# Patient Record
Sex: Female | Born: 1970
Health system: Southern US, Community
[De-identification: ages and names within clinical notes are randomized; demographics above are authoritative.]

## PROBLEM LIST (undated history)

## (undated) DIAGNOSIS — E785 Hyperlipidemia, unspecified: Secondary | ICD-10-CM

## (undated) HISTORY — PX: TUBAL LIGATION: SHX77

## (undated) HISTORY — PX: BACK SURGERY: SHX140

## (undated) HISTORY — PX: APPENDECTOMY: SHX54

---

## 2012-08-26 ENCOUNTER — Other Ambulatory Visit: Payer: Self-pay | Admitting: Family Medicine

## 2012-08-26 DIAGNOSIS — Z1231 Encounter for screening mammogram for malignant neoplasm of breast: Secondary | ICD-10-CM

## 2012-09-15 ENCOUNTER — Ambulatory Visit
Admission: RE | Admit: 2012-09-15 | Discharge: 2012-09-15 | Disposition: A | Discharge status: Home / Self Care | Payer: Self-pay | Source: Ambulatory Visit | Attending: Family Medicine | Admitting: Family Medicine

## 2012-09-15 DIAGNOSIS — Z1231 Encounter for screening mammogram for malignant neoplasm of breast: Secondary | ICD-10-CM

## 2012-09-17 ENCOUNTER — Other Ambulatory Visit: Payer: Self-pay | Admitting: Family Medicine

## 2012-09-17 DIAGNOSIS — R928 Other abnormal and inconclusive findings on diagnostic imaging of breast: Secondary | ICD-10-CM

## 2012-09-30 ENCOUNTER — Ambulatory Visit
Admission: RE | Admit: 2012-09-30 | Discharge: 2012-09-30 | Disposition: A | Discharge status: Home / Self Care | Payer: 59 | Source: Ambulatory Visit | Attending: Family Medicine | Admitting: Family Medicine

## 2012-09-30 DIAGNOSIS — R928 Other abnormal and inconclusive findings on diagnostic imaging of breast: Secondary | ICD-10-CM

## 2013-11-16 ENCOUNTER — Other Ambulatory Visit: Payer: Self-pay

## 2013-11-16 DIAGNOSIS — Z1231 Encounter for screening mammogram for malignant neoplasm of breast: Secondary | ICD-10-CM

## 2013-12-14 ENCOUNTER — Ambulatory Visit: Payer: 59

## 2014-01-11 ENCOUNTER — Other Ambulatory Visit: Payer: Self-pay

## 2014-01-11 ENCOUNTER — Ambulatory Visit
Admission: RE | Admit: 2014-01-11 | Discharge: 2014-01-11 | Disposition: A | Discharge status: Home / Self Care | Payer: 59 | Source: Ambulatory Visit

## 2014-01-11 DIAGNOSIS — Z1231 Encounter for screening mammogram for malignant neoplasm of breast: Secondary | ICD-10-CM

## 2014-08-21 ENCOUNTER — Other Ambulatory Visit: Payer: Self-pay | Admitting: Family Medicine

## 2014-08-21 DIAGNOSIS — N63 Unspecified lump in unspecified breast: Secondary | ICD-10-CM

## 2014-08-23 ENCOUNTER — Other Ambulatory Visit: Payer: Self-pay | Admitting: Family Medicine

## 2014-08-23 ENCOUNTER — Ambulatory Visit
Admission: RE | Admit: 2014-08-23 | Discharge: 2014-08-23 | Disposition: A | Discharge status: Home / Self Care | Payer: 59 | Source: Ambulatory Visit | Attending: Family Medicine | Admitting: Family Medicine

## 2014-08-23 DIAGNOSIS — N63 Unspecified lump in unspecified breast: Secondary | ICD-10-CM

## 2014-08-25 ENCOUNTER — Ambulatory Visit
Admission: RE | Admit: 2014-08-25 | Discharge: 2014-08-25 | Disposition: A | Discharge status: Home / Self Care | Payer: 59 | Source: Ambulatory Visit | Attending: Family Medicine | Admitting: Family Medicine

## 2014-08-25 ENCOUNTER — Other Ambulatory Visit: Payer: Self-pay | Admitting: Family Medicine

## 2014-08-25 DIAGNOSIS — N63 Unspecified lump in unspecified breast: Secondary | ICD-10-CM

## 2014-12-19 ENCOUNTER — Other Ambulatory Visit: Payer: Self-pay

## 2014-12-19 DIAGNOSIS — Z1231 Encounter for screening mammogram for malignant neoplasm of breast: Secondary | ICD-10-CM

## 2015-01-12 ENCOUNTER — Other Ambulatory Visit (HOSPITAL_BASED_OUTPATIENT_CLINIC_OR_DEPARTMENT_OTHER): Payer: Self-pay | Admitting: Family Medicine

## 2015-01-12 DIAGNOSIS — E041 Nontoxic single thyroid nodule: Secondary | ICD-10-CM

## 2015-01-15 ENCOUNTER — Ambulatory Visit: Payer: 59

## 2015-02-07 ENCOUNTER — Ambulatory Visit: Payer: 59

## 2015-02-13 ENCOUNTER — Ambulatory Visit
Admission: RE | Admit: 2015-02-13 | Discharge: 2015-02-13 | Disposition: A | Discharge status: Home / Self Care | Payer: 59 | Source: Ambulatory Visit

## 2015-02-13 DIAGNOSIS — Z1231 Encounter for screening mammogram for malignant neoplasm of breast: Secondary | ICD-10-CM

## 2016-06-04 DIAGNOSIS — R1032 Left lower quadrant pain: Secondary | ICD-10-CM | POA: Diagnosis not present

## 2016-06-06 ENCOUNTER — Other Ambulatory Visit (HOSPITAL_COMMUNITY): Payer: Self-pay | Admitting: Internal Medicine

## 2016-06-06 DIAGNOSIS — N926 Irregular menstruation, unspecified: Secondary | ICD-10-CM

## 2016-06-10 ENCOUNTER — Ambulatory Visit (HOSPITAL_COMMUNITY)
Admission: RE | Admit: 2016-06-10 | Discharge: 2016-06-10 | Disposition: A | Discharge status: Home / Self Care | Payer: 59 | Source: Ambulatory Visit | Attending: Internal Medicine | Admitting: Internal Medicine

## 2016-06-10 DIAGNOSIS — R1032 Left lower quadrant pain: Secondary | ICD-10-CM | POA: Diagnosis present

## 2016-06-10 DIAGNOSIS — N83292 Other ovarian cyst, left side: Secondary | ICD-10-CM | POA: Diagnosis not present

## 2016-06-10 DIAGNOSIS — N926 Irregular menstruation, unspecified: Secondary | ICD-10-CM | POA: Insufficient documentation

## 2016-06-10 DIAGNOSIS — D250 Submucous leiomyoma of uterus: Secondary | ICD-10-CM | POA: Diagnosis not present

## 2016-10-06 DIAGNOSIS — N920 Excessive and frequent menstruation with regular cycle: Secondary | ICD-10-CM | POA: Diagnosis not present

## 2016-10-21 DIAGNOSIS — N924 Excessive bleeding in the premenopausal period: Secondary | ICD-10-CM | POA: Diagnosis not present

## 2016-10-21 DIAGNOSIS — N920 Excessive and frequent menstruation with regular cycle: Secondary | ICD-10-CM | POA: Diagnosis not present

## 2016-10-27 ENCOUNTER — Other Ambulatory Visit: Payer: Self-pay | Admitting: Obstetrics and Gynecology

## 2016-10-27 DIAGNOSIS — R1032 Left lower quadrant pain: Secondary | ICD-10-CM | POA: Diagnosis not present

## 2016-10-27 DIAGNOSIS — N841 Polyp of cervix uteri: Secondary | ICD-10-CM | POA: Diagnosis not present

## 2016-10-27 DIAGNOSIS — N879 Dysplasia of cervix uteri, unspecified: Secondary | ICD-10-CM | POA: Diagnosis not present

## 2016-10-27 DIAGNOSIS — N920 Excessive and frequent menstruation with regular cycle: Secondary | ICD-10-CM | POA: Diagnosis not present

## 2016-12-08 DIAGNOSIS — Z1231 Encounter for screening mammogram for malignant neoplasm of breast: Secondary | ICD-10-CM | POA: Diagnosis not present

## 2016-12-11 ENCOUNTER — Other Ambulatory Visit: Payer: Self-pay | Admitting: Obstetrics and Gynecology

## 2016-12-11 DIAGNOSIS — R928 Other abnormal and inconclusive findings on diagnostic imaging of breast: Secondary | ICD-10-CM

## 2016-12-17 ENCOUNTER — Ambulatory Visit
Admission: RE | Admit: 2016-12-17 | Discharge: 2016-12-17 | Disposition: A | Discharge status: Home / Self Care | Payer: 59 | Source: Ambulatory Visit | Attending: Obstetrics and Gynecology | Admitting: Obstetrics and Gynecology

## 2016-12-17 DIAGNOSIS — R928 Other abnormal and inconclusive findings on diagnostic imaging of breast: Secondary | ICD-10-CM

## 2016-12-17 DIAGNOSIS — N6012 Diffuse cystic mastopathy of left breast: Secondary | ICD-10-CM | POA: Diagnosis not present

## 2016-12-17 DIAGNOSIS — R922 Inconclusive mammogram: Secondary | ICD-10-CM | POA: Diagnosis not present

## 2017-09-29 DIAGNOSIS — Z0000 Encounter for general adult medical examination without abnormal findings: Secondary | ICD-10-CM | POA: Diagnosis not present

## 2017-09-29 DIAGNOSIS — R635 Abnormal weight gain: Secondary | ICD-10-CM | POA: Diagnosis not present

## 2017-09-29 DIAGNOSIS — E785 Hyperlipidemia, unspecified: Secondary | ICD-10-CM | POA: Diagnosis not present

## 2017-11-30 DIAGNOSIS — L600 Ingrowing nail: Secondary | ICD-10-CM | POA: Diagnosis not present

## 2017-11-30 DIAGNOSIS — L03031 Cellulitis of right toe: Secondary | ICD-10-CM | POA: Diagnosis not present

## 2017-11-30 DIAGNOSIS — M79671 Pain in right foot: Secondary | ICD-10-CM | POA: Diagnosis not present

## 2017-12-22 DIAGNOSIS — L600 Ingrowing nail: Secondary | ICD-10-CM | POA: Diagnosis not present

## 2017-12-22 DIAGNOSIS — L03032 Cellulitis of left toe: Secondary | ICD-10-CM | POA: Diagnosis not present

## 2017-12-22 DIAGNOSIS — M79672 Pain in left foot: Secondary | ICD-10-CM | POA: Diagnosis not present

## 2018-03-10 DIAGNOSIS — M5416 Radiculopathy, lumbar region: Secondary | ICD-10-CM | POA: Diagnosis not present

## 2018-03-17 DIAGNOSIS — M47816 Spondylosis without myelopathy or radiculopathy, lumbar region: Secondary | ICD-10-CM | POA: Diagnosis not present

## 2018-03-17 DIAGNOSIS — M5416 Radiculopathy, lumbar region: Secondary | ICD-10-CM | POA: Diagnosis not present

## 2018-03-17 DIAGNOSIS — M5126 Other intervertebral disc displacement, lumbar region: Secondary | ICD-10-CM | POA: Diagnosis not present

## 2018-04-19 ENCOUNTER — Telehealth (HOSPITAL_COMMUNITY): Payer: Self-pay

## 2018-04-19 NOTE — Telephone Encounter (Signed)
Marilyn Gomez was contacted today regarding temporary reduction of Outpatient Rehabilitation Services due to concerns for community transmission of COVID-19.  Patient identity was verified.  Assessed if patient needed to be seen in person by clinician (recent fall or acute injury that requires hands on assessment and advice, change in diet order, post-surgical, special cases, etc.).    Patient did not have an acute/special need that requires in person visit. Proceeded with phone call.  Patient clearly stating that she would contact us when she is ready to come in/get her evaluation scheduled.  The patient was offered and declined the continuation of their plan of care by using a telehealth visit.  Outpatient Rehabilitation Services will follow up with this client when we are able to safely resume care.    Patient is aware we can be reached by telephone during limited business hours in the meantime. (include at end of note)  Geraldine Solar PT, DPT [phone]

## 2018-05-18 ENCOUNTER — Telehealth (HOSPITAL_COMMUNITY): Payer: Self-pay | Admitting: Physical Therapy

## 2018-05-18 NOTE — Telephone Encounter (Signed)
Pt was contacted today by phone regarding resuming therapy services following our temporary closure secondary to COVID-19.  Patient identity was verified via DOB.  Pt states she would like to defer therapy for now as she has her elderly inlaws living with her and doesn't want to expose them to anything.   Pt states she will contact our clinic when she wishes to begin therapy  [name], PTA/CLT [phone]

## 2018-06-10 ENCOUNTER — Telehealth (HOSPITAL_COMMUNITY): Payer: Self-pay

## 2018-06-10 NOTE — Telephone Encounter (Signed)
Lmonvm letting the pt know that our office is now open and if she would like to get scheduled for physical therapy. Asked the pt to give [place] a call back either. Advised that we will close this referral within two weeks if we have not heard back from anyone.

## 2018-09-22 ENCOUNTER — Other Ambulatory Visit (HOSPITAL_COMMUNITY): Payer: Self-pay | Admitting: Obstetrics and Gynecology

## 2018-09-22 DIAGNOSIS — Z1231 Encounter for screening mammogram for malignant neoplasm of breast: Secondary | ICD-10-CM

## 2018-10-06 ENCOUNTER — Ambulatory Visit (HOSPITAL_COMMUNITY): Payer: 59

## 2018-10-07 ENCOUNTER — Other Ambulatory Visit: Payer: Self-pay

## 2018-10-07 ENCOUNTER — Ambulatory Visit (HOSPITAL_COMMUNITY)
Admission: RE | Admit: 2018-10-07 | Discharge: 2018-10-07 | Disposition: A | Discharge status: Home / Self Care | Payer: 59 | Source: Ambulatory Visit | Attending: Obstetrics and Gynecology | Admitting: Obstetrics and Gynecology

## 2018-10-07 DIAGNOSIS — Z1231 Encounter for screening mammogram for malignant neoplasm of breast: Secondary | ICD-10-CM | POA: Diagnosis not present

## 2019-03-25 ENCOUNTER — Ambulatory Visit: Payer: Self-pay | Attending: Internal Medicine

## 2019-03-25 DIAGNOSIS — Z23 Encounter for immunization: Secondary | ICD-10-CM

## 2019-03-25 NOTE — Progress Notes (Signed)
   Covid-19 Vaccination Clinic  Name:  Marilyn Gomez    MRN: [id number] DOB: [birth year]  03/25/2019  [name] was observed post Covid-19 immunization for 15 minutes without incident. She was provided with Vaccine Information Sheet and instruction to access the V-Safe system.   [name] was instructed to call 911 with any severe reactions post vaccine: Marland Kitchen Difficulty breathing  . Swelling of face and throat  . A fast heartbeat  . A bad rash all over body  . Dizziness and weakness   Immunizations Administered    Name Date Dose VIS Date Route   Moderna COVID-19 Vaccine 03/25/2019  9:06 AM 0.5 mL 12/07/2018 Intramuscular   Manufacturer: Moderna   Lot: [id number]   [id number]

## 2019-04-26 ENCOUNTER — Ambulatory Visit: Payer: Self-pay | Attending: Internal Medicine

## 2019-04-26 DIAGNOSIS — Z23 Encounter for immunization: Secondary | ICD-10-CM

## 2019-04-26 NOTE — Progress Notes (Signed)
   Covid-19 Vaccination Clinic  Name:  Marilyn Gomez    MRN: [id number] DOB: 1970-07-31  04/26/2019  [name] was observed post Covid-19 immunization for 15 minutes without incident. She was provided with Vaccine Information Sheet and instruction to access the V-Safe system.   [name] was instructed to call 911 with any severe reactions post vaccine: Marland Kitchen Difficulty breathing  . Swelling of face and throat  . A fast heartbeat  . A bad rash all over body  . Dizziness and weakness   Immunizations Administered    Name Date Dose VIS Date Route   Moderna COVID-19 Vaccine 04/26/2019  8:30 AM 0.5 mL 12/2018 Intramuscular   Manufacturer: Moderna   Lot: [id number]   [place][id number]

## 2019-05-20 ENCOUNTER — Encounter: Payer: Self-pay | Admitting: *Deleted

## 2019-08-11 ENCOUNTER — Ambulatory Visit (INDEPENDENT_AMBULATORY_CARE_PROVIDER_SITE_OTHER): Payer: Self-pay | Admitting: *Deleted

## 2019-08-11 DIAGNOSIS — Z1211 Encounter for screening for malignant neoplasm of colon: Secondary | ICD-10-CM

## 2019-08-11 MED ORDER — PEG 3350-KCL-NA BICARB-NACL 420 G PO SOLR: 4000.0000 mL | Freq: Once | ORAL | 0 refills | Status: AC

## 2019-08-11 NOTE — Patient Instructions (Signed)
Marilyn Gomez   05-18-1970 MRN: [id number]    Procedure Date: 10/03/2019 Time to register: 9:00 am Place to register: [facility] Procedure Time: 10:30 Scheduled provider: Dr. Abbey Chatters  PREPARATION FOR COLONOSCOPY WITH TRI-LYTE SPLIT PREP  Please notify us immediately if you are diabetic, take iron supplements, or if you are on Coumadin or any other blood thinners.   Please hold the following medications: n/a  You will need to purchase 1 fleet enema and 1 box of Bisacodyl '5mg'$  tablets.   2 DAYS BEFORE PROCEDURE:  DATE: 10/01/2019   DAY: Saturday Begin clear liquid diet AFTER your lunch meal. NO SOLID FOODS after this point.  1 DAY BEFORE PROCEDURE:  DATE: 10/02/2019   DAY: Sunday Continue clear liquids the entire day - NO SOLID FOOD.   Diabetic medications adjustments for today: n/a  At 2:00 pm:  Take 2 Bisacodyl tablets.   At 4:00pm:  Start drinking your solution. Make sure you mix well per instructions on the bottle. Try to drink 1 (one) 8 ounce glass every 10-15 minutes until you have consumed HALF the jug. You should complete by 6:00pm.You must keep the left over solution refrigerated until completed next day.  Continue clear liquids. You must drink plenty of clear liquids to prevent dehyration and kidney failure.     DAY OF PROCEDURE:   DATE: 10/03/2019   DAY: Monday If you take medications for your heart, blood pressure or breathing, you may take these medications.  Diabetic medications adjustments for today: n/a  Five hours before your procedure time @ 5:30 am:  Finish remaining amout of bowel prep, drinking 1 (one) 8 ounce glass every 10-15 minutes until complete. You have two hours to consume remaining prep.   Three hours before your procedure time @ 7:30 am:  Nothing by mouth.   At least one hour before going to the hospital:  Give yourself one Fleet enema. You may take your morning medications with sip of water unless we have instructed otherwise.       Please see below for Dietary Information.  CLEAR LIQUIDS INCLUDE:  Water Jello (NOT red in color)   Ice Popsicles (NOT red in color)   Tea (sugar ok, no milk/cream) Powdered fruit flavored drinks  Coffee (sugar ok, no milk/cream) Gatorade/ Lemonade/ Kool-Aid  (NOT red in color)   Juice: apple, white grape, white cranberry Soft drinks  Clear bullion, consomme, broth (fat free beef/chicken/vegetable)  Carbonated beverages (any kind)  Strained chicken noodle soup Hard Candy   Remember: Clear liquids are liquids that will allow you to see your fingers on the other side of a clear glass. Be sure liquids are NOT red in color, and not cloudy, but CLEAR.  DO NOT EAT OR DRINK ANY OF THE FOLLOWING:  Dairy products of any kind   Cranberry juice Tomato juice / V8 juice   Grapefruit juice Orange juice     Red grape juice  Do not eat any solid foods, including such foods as: cereal, oatmeal, yogurt, fruits, vegetables, creamed soups, eggs, bread, crackers, pureed foods in a blender, etc.   HELPFUL HINTS FOR DRINKING PREP SOLUTION:   Make sure prep is extremely cold. Mix and refrigerate the the morning of the prep. You may also put in the freezer.   You may try mixing some Crystal Light or Country Time Lemonade if you prefer. Mix in small amounts; add more if necessary.  Try drinking through a straw  Rinse mouth with water or a  mouthwash between glasses, to remove after-taste.  Try sipping on a cold beverage /ice/ popsicles between glasses of prep.  Place a piece of sugar-free hard candy in mouth between glasses.  If you become nauseated, try consuming smaller amounts, or stretch out the time between glasses. Stop for 30-60 minutes, then slowly start back drinking.        OTHER INSTRUCTIONS  You will need a responsible adult at least 51 years of age to accompany you and drive you home. This person must remain in the waiting room during your procedure. The hospital will cancel  your procedure if you do not have a responsible adult with you.   1. Wear loose fitting clothing that is easily removed. 2. Leave jewelry and other valuables at home.  3. Remove all body piercing jewelry and leave at home. 4. Total time from sign-in until discharge is approximately 2-3 hours. 5. You should go home directly after your procedure and rest. You can resume normal activities the day after your procedure. 6. The day of your procedure you should not:  Drive  Make legal decisions  Operate machinery  Drink alcohol  Return to work   You may call the office (Dept: [phone]) before 5:00pm, or page the doctor on call [phone]) after 5:00pm, for further instructions, if necessary.   Insurance Information YOU WILL NEED TO CHECK WITH YOUR INSURANCE COMPANY FOR THE BENEFITS OF COVERAGE YOU HAVE FOR THIS PROCEDURE.  UNFORTUNATELY, NOT ALL INSURANCE COMPANIES HAVE BENEFITS TO COVER ALL OR PART OF THESE TYPES OF PROCEDURES.  IT IS YOUR RESPONSIBILITY TO CHECK YOUR BENEFITS, HOWEVER, WE WILL BE GLAD TO ASSIST YOU WITH ANY CODES YOUR INSURANCE COMPANY MAY NEED.    PLEASE NOTE THAT MOST INSURANCE COMPANIES WILL NOT COVER A SCREENING COLONOSCOPY FOR PEOPLE UNDER THE AGE OF 50  IF YOU HAVE BCBS INSURANCE, YOU MAY HAVE BENEFITS FOR A SCREENING COLONOSCOPY BUT IF POLYPS ARE FOUND THE DIAGNOSIS WILL CHANGE AND THEN YOU MAY HAVE A DEDUCTIBLE THAT WILL NEED TO BE MET. SO PLEASE MAKE SURE YOU CHECK YOUR BENEFITS FOR A SCREENING COLONOSCOPY AS WELL AS A DIAGNOSTIC COLONOSCOPY.

## 2019-08-11 NOTE — Progress Notes (Addendum)
Gastroenterology Pre-Procedure Review  Request Date: 08/11/2019 Requesting Physician: Dr. Aura Dials @ [facility], no previous TCS, Family hx colon cancer (father)  PATIENT REVIEW QUESTIONS: The patient responded to the following health history questions as indicated:    1. Diabetes Melitis: no 2. Joint replacements in the past 12 months: no 3. Major health problems in the past 3 months: no 4. Has an artificial valve or MVP: no 5. Has a defibrillator: no 6. Has been advised in past to take antibiotics in advance of a procedure like teeth cleaning: no 7. Family history of colon cancer: yes, father: age 35  8. Alcohol Use: yes, 3 glasses of wine a weekend 9. Illicit drug Use: no 10. History of sleep apnea: no  11. History of coronary artery or other vascular stents placed within the last 12 months: no 12. History of any prior anesthesia complications: no 13. There is no height or weight on file to calculate BMI. ht: 6'0 wt: 168 lbs    MEDICATIONS & ALLERGIES:    Patient reports the following regarding taking any blood thinners:   Plavix? no Aspirin? no Coumadin? no Brilinta? no Xarelto? no Eliquis? no Pradaxa? no Savaysa? no Effient? no  Patient confirms/reports the following medications:  Current Outpatient Medications  Medication Sig Dispense Refill  . atorvastatin (LIPITOR) 40 MG tablet Take 40 mg by mouth daily.     No current facility-administered medications for this visit.    Patient confirms/reports the following allergies:  No Known Allergies  No orders of the defined types were placed in this encounter.   AUTHORIZATION INFORMATION Primary Insurance: [place],  [place] #:[id number] ,  Group #: [id number] Pre-Cert / Auth required: Yes, approved online for 2/75/1700-17/49/4496 Pre-Cert / Auth #: [id number]  SCHEDULE INFORMATION: Procedure has been scheduled as follows:  Date: 10/03/2019, Time: 10:30 Location: [facility] with Dr. Abbey Chatters  This  Gastroenterology Pre-Precedure Review Form is being routed to the following provider(s): Roseanne Kaufman, NP

## 2019-08-29 NOTE — Progress Notes (Signed)
Appropriate. ASA II.  

## 2019-09-01 ENCOUNTER — Other Ambulatory Visit: Payer: Self-pay | Admitting: *Deleted

## 2019-09-01 ENCOUNTER — Other Ambulatory Visit (HOSPITAL_COMMUNITY): Payer: Self-pay | Admitting: Obstetrics and Gynecology

## 2019-09-01 DIAGNOSIS — Z1231 Encounter for screening mammogram for malignant neoplasm of breast: Secondary | ICD-10-CM

## 2019-09-26 ENCOUNTER — Encounter (HOSPITAL_COMMUNITY): Payer: Self-pay | Admitting: *Deleted

## 2019-09-30 ENCOUNTER — Other Ambulatory Visit (HOSPITAL_COMMUNITY)
Admission: RE | Admit: 2019-09-30 | Discharge: 2019-09-30 | Disposition: A | Discharge status: Home / Self Care | Payer: 59 | Source: Ambulatory Visit | Attending: Internal Medicine | Admitting: Internal Medicine

## 2019-09-30 ENCOUNTER — Other Ambulatory Visit: Payer: Self-pay

## 2019-09-30 DIAGNOSIS — Z01812 Encounter for preprocedural laboratory examination: Secondary | ICD-10-CM | POA: Diagnosis not present

## 2019-09-30 DIAGNOSIS — Z20822 Contact with and (suspected) exposure to covid-19: Secondary | ICD-10-CM | POA: Insufficient documentation

## 2019-09-30 LAB — SARS CORONAVIRUS 2 (TAT 6-24 HRS): SARS Coronavirus 2: NEGATIVE

## 2019-10-03 ENCOUNTER — Encounter (HOSPITAL_COMMUNITY)
Admission: RE | Disposition: A | Discharge status: Home / Self Care | Payer: Self-pay | Source: Home / Self Care | Attending: Internal Medicine

## 2019-10-03 ENCOUNTER — Ambulatory Visit (HOSPITAL_COMMUNITY): Payer: 59 | Admitting: Anesthesiology

## 2019-10-03 ENCOUNTER — Encounter (HOSPITAL_COMMUNITY): Payer: Self-pay

## 2019-10-03 ENCOUNTER — Ambulatory Visit (HOSPITAL_COMMUNITY)
Admission: RE | Admit: 2019-10-03 | Discharge: 2019-10-03 | Disposition: A | Discharge status: Home / Self Care | Payer: 59 | Attending: Internal Medicine | Admitting: Internal Medicine

## 2019-10-03 ENCOUNTER — Other Ambulatory Visit: Payer: Self-pay

## 2019-10-03 DIAGNOSIS — K648 Other hemorrhoids: Secondary | ICD-10-CM | POA: Diagnosis not present

## 2019-10-03 DIAGNOSIS — Z9049 Acquired absence of other specified parts of digestive tract: Secondary | ICD-10-CM | POA: Diagnosis not present

## 2019-10-03 DIAGNOSIS — K635 Polyp of colon: Secondary | ICD-10-CM

## 2019-10-03 DIAGNOSIS — Z79899 Other long term (current) drug therapy: Secondary | ICD-10-CM | POA: Diagnosis not present

## 2019-10-03 DIAGNOSIS — F17210 Nicotine dependence, cigarettes, uncomplicated: Secondary | ICD-10-CM | POA: Diagnosis not present

## 2019-10-03 DIAGNOSIS — D125 Benign neoplasm of sigmoid colon: Secondary | ICD-10-CM | POA: Diagnosis not present

## 2019-10-03 DIAGNOSIS — E785 Hyperlipidemia, unspecified: Secondary | ICD-10-CM | POA: Diagnosis not present

## 2019-10-03 DIAGNOSIS — D123 Benign neoplasm of transverse colon: Secondary | ICD-10-CM | POA: Diagnosis not present

## 2019-10-03 DIAGNOSIS — Z1211 Encounter for screening for malignant neoplasm of colon: Secondary | ICD-10-CM | POA: Insufficient documentation

## 2019-10-03 DIAGNOSIS — Z800 Family history of malignant neoplasm of digestive organs: Secondary | ICD-10-CM | POA: Diagnosis not present

## 2019-10-03 HISTORY — PX: COLONOSCOPY WITH PROPOFOL: SHX5780

## 2019-10-03 HISTORY — DX: Hyperlipidemia, unspecified: E78.5

## 2019-10-03 HISTORY — PX: POLYPECTOMY: SHX5525

## 2019-10-03 SURGERY — COLONOSCOPY WITH PROPOFOL
Anesthesia: General

## 2019-10-03 MED ORDER — LACTATED RINGERS IV SOLN: INTRAVENOUS | Status: DC | PRN

## 2019-10-03 MED ORDER — PROPOFOL 10 MG/ML IV BOLUS
INTRAVENOUS | Status: DC | PRN
  Administered 2019-10-03: 80 mg via INTRAVENOUS
  Administered 2019-10-03: 50 mg via INTRAVENOUS
  Administered 2019-10-03: 150 ug/kg/min via INTRAVENOUS

## 2019-10-03 NOTE — Anesthesia Preprocedure Evaluation (Addendum)
Anesthesia Evaluation  Patient identified by MRN, date of birth, ID band Patient awake    Reviewed: Allergy & Precautions, NPO status , Patient's Chart, lab work & pertinent test results  History of Anesthesia Complications Negative for: history of anesthetic complications  Airway Mallampati: II  TM Distance: >3 FB Neck ROM: Full    Dental  (+) Dental Advisory Given, Teeth Intact, Caps,    Pulmonary Current SmokerPatient did not abstain from smoking.,    Pulmonary exam normal breath sounds clear to auscultation       Cardiovascular Exercise Tolerance: Good Normal cardiovascular exam Rhythm:Regular Rate:Normal     Neuro/Psych negative neurological ROS  negative psych ROS   GI/Hepatic negative GI ROS, (+)     substance abuse  alcohol use,   Endo/Other  negative endocrine ROS  Renal/GU negative Renal ROS     Musculoskeletal negative musculoskeletal ROS (+)   Abdominal   Peds  Hematology negative hematology ROS (+)   Anesthesia Other Findings   Reproductive/Obstetrics negative OB ROS                            Anesthesia Physical Anesthesia Plan  ASA: II  Anesthesia Plan: General   Post-op Pain Management:    Induction: Intravenous  PONV Risk Score and Plan: TIVA  Airway Management Planned: Nasal Cannula and Natural Airway  Additional Equipment:   Intra-op Plan:   Post-operative Plan: Extubation in OR  Informed Consent: I have reviewed the patients History and Physical, chart, labs and discussed the procedure including the risks, benefits and alternatives for the proposed anesthesia with the patient or authorized representative who has indicated his/her understanding and acceptance.     Dental advisory given  Plan Discussed with: CRNA and Surgeon  Anesthesia Plan Comments:         Anesthesia Quick Evaluation

## 2019-10-03 NOTE — Discharge Instructions (Addendum)
Colon Polyps  Polyps are tissue growths inside the body. Polyps can grow in many places, including the large intestine (colon). A polyp may be a round bump or a mushroom-shaped growth. You could have one polyp or several. Most colon polyps are noncancerous (benign). However, some colon polyps can become cancerous over time. Finding and removing the polyps early can help prevent this. What are the causes? The exact cause of colon polyps is not known. What increases the risk? You are more likely to develop this condition if you:  Have a family history of colon cancer or colon polyps.  Are older than 29 or older than 45 if you are African American.  Have inflammatory bowel disease, such as ulcerative colitis or Crohn's disease.  Have certain hereditary conditions, such as: ? Familial adenomatous polyposis. ? Lynch syndrome. ? Turcot syndrome. ? Peutz-Jeghers syndrome.  Are overweight.  Smoke cigarettes.  Do not get enough exercise.  Drink too much alcohol.  Eat a diet that is high in fat and red meat and low in fiber.  Had childhood cancer that was treated with abdominal radiation. What are the signs or symptoms? Most polyps do not cause symptoms. If you have symptoms, they may include:  Blood coming from your rectum when having a bowel movement.  Blood in your stool. The stool may look dark red or black.  Abdominal pain.  A change in bowel habits, such as constipation or diarrhea. How is this diagnosed? This condition is diagnosed with a colonoscopy. This is a procedure in which a lighted, flexible scope is inserted into the anus and then passed into the colon to examine the area. Polyps are sometimes found when a colonoscopy is done as part of routine cancer screening tests. How is this treated? Treatment for this condition involves removing any polyps that are found. Most polyps can be removed during a colonoscopy. Those polyps will then be tested for cancer. Additional  treatment may be needed depending on the results of testing. Follow these instructions at home: Lifestyle  Maintain a healthy weight, or lose weight if recommended by your health care provider.  Exercise every day or as told by your health care provider.  Do not use any products that contain nicotine or tobacco, such as cigarettes and e-cigarettes. If you need help quitting, ask your health care provider.  If you drink alcohol, limit how much you have: ? 0-1 drink a day for women. ? 0-2 drinks a day for men.  Be aware of how much alcohol is in your drink. In the [place] one drink equals one 12 oz bottle of beer (355 mL), one 5 oz glass of wine (148 mL), or one 1 oz shot of hard liquor (44 mL). Eating and drinking   Eat foods that are high in fiber, such as fruits, vegetables, and whole grains.  Eat foods that are high in calcium and vitamin D, such as milk, cheese, yogurt, eggs, liver, fish, and broccoli.  Limit foods that are high in fat, such as fried foods and desserts.  Limit the amount of red meat and processed meat you eat, such as hot dogs, sausage, bacon, and lunch meats. General instructions  Keep all follow-up visits as told by your health care provider. This is important. ? This includes having regularly scheduled colonoscopies. ? Talk to your health care provider about when you need a colonoscopy. Contact a health care provider if:  You have new or worsening bleeding during a bowel movement.  You  have new or increased blood in your stool.  You have a change in bowel habits.  You lose weight for no known reason. Summary  Polyps are tissue growths inside the body. Polyps can grow in many places, including the colon.  Most colon polyps are noncancerous (benign), but some can become cancerous over time.  This condition is diagnosed with a colonoscopy.  Treatment for this condition involves removing any polyps that are found. Most polyps can be removed during a  colonoscopy. This information is not intended to replace advice given to you by your health care provider. Make sure you discuss any questions you have with your health care provider. Document Revised: 04/09/2017 Document Reviewed: 04/09/2017 Elsevier Patient Education  [place].  Colonoscopy Discharge Instructions  Read the instructions outlined below and refer to this sheet in the next few weeks. These discharge instructions provide you with general information on caring for yourself after you leave the hospital. Your doctor may also give you specific instructions. While your treatment has been planned according to the most current medical practices available, unavoidable complications occasionally occur.   ACTIVITY  You may resume your regular activity, but move at a slower pace for the next 24 hours.   Take frequent rest periods for the next 24 hours.   Walking will help get rid of the air and reduce the bloated feeling in your belly (abdomen).   No driving for 24 hours (because of the medicine (anesthesia) used during the test).    Do not sign any important legal documents or operate any machinery for 24 hours (because of the anesthesia used during the test).  NUTRITION  Drink plenty of fluids.   You may resume your normal diet as instructed by your doctor.   Begin with a light meal and progress to your normal diet. Heavy or fried foods are harder to digest and may make you feel sick to your stomach (nauseated).   Avoid alcoholic beverages for 24 hours or as instructed.  MEDICATIONS  You may resume your normal medications unless your doctor tells you otherwise.  WHAT YOU CAN EXPECT TODAY  Some feelings of bloating in the abdomen.   Passage of more gas than usual.   Spotting of blood in your stool or on the toilet paper.  IF YOU HAD POLYPS REMOVED DURING THE COLONOSCOPY:  No aspirin products for 7 days or as instructed.   No alcohol for 7 days or as  instructed.   Eat a soft diet for the next 24 hours.  FINDING OUT THE RESULTS OF YOUR TEST Not all test results are available during your visit. If your test results are not back during the visit, make an appointment with your caregiver to find out the results. Do not assume everything is normal if you have not heard from your caregiver or the medical facility. It is important for you to follow up on all of your test results.  SEEK IMMEDIATE MEDICAL ATTENTION IF:  You have more than a spotting of blood in your stool.   Your belly is swollen (abdominal distention).   You are nauseated or vomiting.   You have a temperature over 101.   You have abdominal pain or discomfort that is severe or gets worse throughout the day.   Your colonoscopy showed 2 polyps, 1 of which was rather large, that I removed successfully.  Await pathology results, my office will contact you hopefully by the end of the week.  Based on current  guidelines, I would recommend we repeat colonoscopy in 3 years.  Follow-up with GI as needed.  I hope you have a great rest of your week!  Elon Alas. Abbey Chatters, D.O. Gastroenterology and Hepatology [facility]

## 2019-10-03 NOTE — Transfer of Care (Signed)
Immediate Anesthesia Transfer of Care Note  Patient: Marilyn Gomez  Procedure(s) Performed: COLONOSCOPY WITH PROPOFOL (N/A ) POLYPECTOMY  Patient Location: Endoscopy Unit  Anesthesia Type:General  Level of Consciousness: awake, alert , oriented and patient cooperative  Airway & Oxygen Therapy: Patient Spontanous Breathing  Post-op Assessment: Report given to RN, Post -op Vital signs reviewed and stable and Patient moving all extremities  Post vital signs: Reviewed and stable  Last Vitals:  Vitals Value Taken Time  BP    Temp    Pulse    Resp    SpO2      Last Pain:  Vitals:   10/03/19 1008  TempSrc:   PainSc: 0-No pain         Complications: No complications documented.

## 2019-10-03 NOTE — H&P (Signed)
Primary Care Physician:  Aura Dials, MD Primary Gastroenterologist:  Dr. Abbey Chatters  Pre-Procedure History & Physical: HPI:  Natalin Bible is a 49 y.o. female is here for a colonoscopy to be performed for screening and family hx of colon cancer (father)  Past Medical History:  Diagnosis Date  . Hyperlipemia     Past Surgical History:  Procedure Laterality Date  . APPENDECTOMY    . BACK SURGERY    . TUBAL LIGATION      Prior to Admission medications   Medication Sig Start Date End Date Taking? Authorizing Provider  atorvastatin (LIPITOR) 40 MG tablet Take 40 mg by mouth at bedtime.    Yes [provider]  polyethylene glycol-electrolytes (NULYTELY) 420 g solution Take 4,000 mLs by mouth as directed. 08/11/19   [provider]    Allergies as of 08/30/2019  . (No Known Allergies)    No family history on file.  Social History   Socioeconomic History  . Marital status: Married    Spouse name: Not on file  . Number of children: Not on file  . Years of education: Not on file  . Highest education level: Not on file  Occupational History  . Not on file  Tobacco Use  . Smoking status: Current Every Day Smoker    Packs/day: 0.50  Vaping Use  . Vaping Use: Never used  Substance and Sexual Activity  . Alcohol use: Yes    Comment: rarely  . Drug use: Not on file  . Sexual activity: Not on file  Other Topics Concern  . Not on file  Social History Narrative  . Not on file   Social Determinants of Health   Financial Resource Strain:   . Difficulty of Paying Living Expenses: Not on file  Food Insecurity:   . Worried About Charity fundraiser in the Last Year: Not on file  . Ran Out of Food in the Last Year: Not on file  Transportation Needs:   . Lack of Transportation (Medical): Not on file  . Lack of Transportation (Non-Medical): Not on file  Physical Activity:   . Days of Exercise per Week: Not on file  . Minutes of Exercise per Session: Not on  file  Stress:   . Feeling of Stress : Not on file  Social Connections:   . Frequency of Communication with Friends and Family: Not on file  . Frequency of Social Gatherings with Friends and Family: Not on file  . Attends Religious Services: Not on file  . Active Member of Clubs or Organizations: Not on file  . Attends Archivist Meetings: Not on file  . Marital Status: Not on file  Intimate Partner Violence:   . Fear of Current or Ex-Partner: Not on file  . Emotionally Abused: Not on file  . Physically Abused: Not on file  . Sexually Abused: Not on file    Review of Systems: See HPI, otherwise negative ROS  Impression/Plan: Amberli Ruegg is here for a colonoscopy to be performed for screening and family hx of colon cancer (father)  Risks, benefits, limitations, imponderables and alternatives regarding colonoscopy have been reviewed with the patient. Questions have been answered. All parties agreeable.

## 2019-10-03 NOTE — Anesthesia Postprocedure Evaluation (Signed)
Anesthesia Post Note  Patient: Marilyn Gomez  Procedure(s) Performed: COLONOSCOPY WITH PROPOFOL (N/A ) POLYPECTOMY  Patient location during evaluation: Endoscopy Anesthesia Type: General Level of consciousness: awake, oriented, awake and alert and patient cooperative Pain management: pain level controlled Vital Signs Assessment: post-procedure vital signs reviewed and stable Respiratory status: spontaneous breathing, respiratory function stable and nonlabored ventilation Cardiovascular status: blood pressure returned to baseline and stable Postop Assessment: no headache and no backache Anesthetic complications: no   No complications documented.   Last Vitals:  Vitals:   10/03/19 0917  BP: 105/67  Pulse: 73  Resp: (!) 24  Temp: 36.6 C  SpO2: 100%    Last Pain:  Vitals:   10/03/19 1008  TempSrc:   PainSc: 0-No pain                 Tacy Learn

## 2019-10-03 NOTE — Op Note (Signed)
[facility] Patient Name: Marilyn Gomez Procedure Date: 10/03/2019 9:19 AM MRN: [id number] Date of Birth: 03/25/1970 Attending MD: Elon Alas. Abbey Chatters DO CSN: [id number] Age: 49 Admit Type: Ambulatory Procedure:                Colonoscopy Indications:              Screening in patient at increased risk: Family                            history of 1st-degree relative with colorectal                            cancer Providers:                Elon Alas. Fenna Semel, DO, Otis Peak B. Sharon Seller, RN,                            Clayton Lefort, RN, Wynonia Musty Tech, Technician Referring MD:              Medicines:                See the Anesthesia note for documentation of the                            administered medications Complications:            No immediate complications. Estimated Blood Loss:     Estimated blood loss was minimal. Procedure:                Pre-Anesthesia Assessment:                           - The anesthesia plan was to use monitored                            anesthesia care (MAC).                           After obtaining informed consent, the colonoscope                            was passed under direct vision. Throughout the                            procedure, the patient's blood pressure, pulse, and                            oxygen saturations were monitored continuously. The                            PCF-H190DL ([id number]) scope was introduced through                            the anus and advanced to the the cecum, identified                            by appendiceal orifice and  ileocecal valve. The                            PCF-H190DL ([id number]) scope was introduced through                            the and advanced to the. The colonoscopy was                            performed without difficulty. The patient tolerated                            the procedure well. The quality of the bowel                            preparation was evaluated using the  BBPS [facility]                            Bowel Preparation Scale) with scores of: Right                            Colon = 3, Transverse Colon = 3 and Left Colon = 3                            (entire mucosa seen well with no residual staining,                            small fragments of stool or opaque liquid). The                            total BBPS score equals 9. Scope In: 10:11:41 AM Scope Out: 10:30:47 AM Scope Withdrawal Time: 0 hours 11 minutes 32 seconds  Total Procedure Duration: 0 hours 19 minutes 6 seconds  Findings:      Non-bleeding internal hemorrhoids were found during endoscopy.      Hemorrhoids were found on perianal exam.      A 8 mm polyp was found in the transverse colon. The polyp was flat. The       polyp was removed with a cold snare. Resection and retrieval were       complete.      A 12 mm polyp was found in the sigmoid colon. The polyp was       pedunculated. The polyp was removed with a hot snare. Resection and       retrieval were complete.      The exam was otherwise without abnormality. Impression:               - Non-bleeding internal hemorrhoids.                           - Hemorrhoids found on perianal exam.                           - One 8 mm polyp in the transverse colon, removed  with a cold snare. Resected and retrieved.                           - One 12 mm polyp in the sigmoid colon, removed                            with a hot snare. Resected and retrieved.                           - The examination was otherwise normal. Moderate Sedation:      Per Anesthesia Care Recommendation:           - Patient has a contact number available for                            emergencies. The signs and symptoms of potential                            delayed complications were discussed with the                            patient. Return to normal activities tomorrow.                            Written discharge instructions  were provided to the                            patient.                           - Resume previous diet.                           - Continue present medications.                           - Await pathology results.                           - Repeat colonoscopy in 3 years for surveillance.                           - Return to GI clinic PRN. Procedure Code(s):        --- Professional ---                           [phone], Colonoscopy, flexible; with removal of                            tumor(s), polyp(s), or other lesion(s) by snare                            technique Diagnosis Code(s):        --- Professional ---  Z80.0, Family history of malignant neoplasm of                            digestive organs                           K64.8, Other hemorrhoids                           K63.5, Polyp of colon CPT copyright 2019 American [employer] Association. All rights reserved. The codes documented in this report are preliminary and upon coder review may  be revised to meet current compliance requirements. Elon Alas. Abbey Chatters, DO [place] Abbey Chatters, DO 10/03/2019 10:35:40 AM This report has been signed electronically. Number of Addenda: 0

## 2019-10-04 LAB — SURGICAL PATHOLOGY

## 2019-10-07 ENCOUNTER — Encounter (HOSPITAL_COMMUNITY): Payer: Self-pay | Admitting: Internal Medicine

## 2019-10-12 ENCOUNTER — Ambulatory Visit (HOSPITAL_COMMUNITY)
Admission: RE | Admit: 2019-10-12 | Discharge: 2019-10-12 | Disposition: A | Discharge status: Home / Self Care | Payer: 59 | Source: Ambulatory Visit | Attending: Obstetrics and Gynecology | Admitting: Obstetrics and Gynecology

## 2019-10-12 ENCOUNTER — Other Ambulatory Visit: Payer: Self-pay

## 2019-10-12 DIAGNOSIS — Z1231 Encounter for screening mammogram for malignant neoplasm of breast: Secondary | ICD-10-CM

## 2020-02-12 ENCOUNTER — Other Ambulatory Visit: Payer: 59

## 2020-09-17 ENCOUNTER — Other Ambulatory Visit (HOSPITAL_COMMUNITY): Payer: Self-pay | Admitting: Family Medicine

## 2020-09-17 DIAGNOSIS — Z1231 Encounter for screening mammogram for malignant neoplasm of breast: Secondary | ICD-10-CM

## 2020-09-28 ENCOUNTER — Other Ambulatory Visit: Payer: Self-pay

## 2020-09-28 ENCOUNTER — Ambulatory Visit: Payer: 59

## 2020-09-28 ENCOUNTER — Ambulatory Visit (INDEPENDENT_AMBULATORY_CARE_PROVIDER_SITE_OTHER): Payer: 59 | Admitting: Orthopedic Surgery

## 2020-09-28 ENCOUNTER — Encounter: Payer: Self-pay | Admitting: Orthopedic Surgery

## 2020-09-28 VITALS — BP 113/75 | HR 66 | Ht 71.0 in | Wt 164.0 lb

## 2020-09-28 DIAGNOSIS — M7711 Lateral epicondylitis, right elbow: Secondary | ICD-10-CM

## 2020-09-28 DIAGNOSIS — M25521 Pain in right elbow: Secondary | ICD-10-CM | POA: Diagnosis not present

## 2020-09-28 NOTE — Patient Instructions (Signed)
Instructions Following Joint Injections  In clinic today, you received an injection in one of your joints (sometimes more than one).  Occasionally, you can have some pain at the injection site, this is normal.  You can place ice at the injection site, or take over-the-counter medications such as Tylenol (acetaminophen) or Advil (ibuprofen).  Please follow all directions listed on the bottle.  If your joint (knee or shoulder) becomes swollen, red or very painful, please contact the clinic for additional assistance.   Two medications were injected, including lidocaine and a steroid (often referred to as cortisone).  Lidocaine is effective almost immediately but wears off quickly.  However, the steroid can take a few days to improve your symptoms.  In some cases, it can make your pain worse for a couple of days.  Do not be concerned if this happens as it is common.  You can apply ice or take some over-the-counter medications as needed.      T[facility] Elbow Rehab Do exercises exactly as told by your health care provider and adjust them as directed. It is normal to feel mild stretching, pulling, tightness, or discomfort as you do these exercises. Stop right away if you feel sudden pain or your pain gets worse.   Stretching and range-of-motion exercises These exercises warm up your muscles and joints and improve the movement andflexibility of your elbow. Wrist flexion, assisted  Straighten your left / right elbow in front of you with your palm facing down toward the floor. If told by your health care provider, bend your left / right elbow to a 90-degree angle (right angle) at your side instead of holding it straight. With your other hand, gently push over the back of your left / right hand so your fingers point toward the floor (flexion). Stop when you feel a gentle stretch on the back of your forearm. Hold this position for 10 seconds. Repeat 10 times. Complete this exercise 1-2 times a day. Wrist  extension, assisted  Straighten your left / right elbow in front of you with your palm facing up toward the ceiling. If told by your health care provider, bend your left / right elbow to a 90-degree angle (right angle) at your side instead of holding it straight. With your other hand, gently pull your left / right hand and fingers toward the floor (extension). Stop when you feel a gentle stretch on the palm side of your forearm. Hold this position for 10 seconds. Repeat 10 times. Complete this exercise 1-2 times a day. Assisted forearm rotation, supination Sit or stand with your elbows at your side. Bend your left / right elbow to a 90-degree angle (right angle). Using your uninjured hand, turn your left / right palm up toward the ceiling (supination) until you feel a gentle stretch along the inside of your forearm. Hold this position for 10 seconds. Repeat 10 times. Complete this exercise 1-2 times a day. Assisted forearm rotation, pronation Sit or stand with your elbows at your side. Bend your left / right elbow to a 90-degree angle (right angle). Using your uninjured hand, turn your left / right palm down toward the floor (pronation) until you feel a gentle stretch along the outside of your forearm. Hold this position for 10 seconds. Repeat 10 times. Complete this exercise 1-2 times a day. Strengthening exercises These exercises build strength and endurance in your forearm and elbow. Endurance is the ability to use your muscles for a long time, even after theyget tired. Radial   deviation  Stand with a 5 lbs weight or a hammer in your left / right hand. Or, sit while holding a rubber exercise band or tubing, with your left / right forearm supported on a table or countertop. Position your forearm so that the thumb is facing the ceiling, as if you are going to clap your hands. This is the neutral position. Raise your hand upward in front of you so your thumb moves toward the ceiling (radial  deviation), or pull up on the rubber tubing. Keep your forearm and elbow still while you move your wrist only. Hold this position for 10 seconds. Slowly return to the starting position. Repeat 10 times. Complete this exercise 1-2 times a day. Wrist extension, eccentric Sit with your left / right forearm palm-down and supported on a table or other surface. Let your left / right wrist extend over the edge of the surface. Hold a 5 lbs (can of soup) weight or a piece of exercise band or tubing in your left / right hand. If using a rubber exercise band or tubing, hold the other end of the tubing with your other hand. Use your uninjured hand to move your left / right hand up toward the ceiling. Take your uninjured hand away and slowly return to the starting position using only your left / right hand. Lowering your arm under tension is called eccentric extension. Repeat 10 times. Complete this exercise 1-2 times a day. Wrist extension  Do not do this exercise if it causes pain at the outside of your elbow. Only do this exercise once instructed by your health care provider. Sit with your left / right forearm supported on a table or other surface and your palm turned down toward the floor. Let your left / right wrist extend over the edge of the surface. Hold a 5 lbs weight or a piece of rubber exercise band or tubing. If you are using a rubber exercise band or tubing, hold the band or tubing in place with your other hand to provide resistance. Slowly bend your wrist so your hand moves up toward the ceiling (extension). Move only your wrist, keeping your forearm and elbow still. Hold this position for 10 seconds. Slowly return to the starting position. Repeat 10 times. Complete this exercise 1-2 times a day. Forearm rotation, supination To do this exercise, you will need a lightweight hammer or rubber mallet. Sit with your left / right forearm supported on a table or other surface. Bend your elbow to a  90-degree angle (right angle). Position your forearm so that your palm is facing down toward the floor, with your hand resting over the edge of the table. Hold a hammer in your left / right hand. To make this exercise easier, hold the hammer near the head of the hammer. To make this exercise harder, hold the hammer near the end of the handle. Without moving your wrist or elbow, slowly rotate your forearm so your palm faces up toward the ceiling (supination). Hold this position for 10 seconds. Slowly return to the starting position. Repeat 10 times. Complete this exercise 1-2 times a day. Shoulder blade squeeze Sit in a stable chair or stand with good posture. If you are sitting down, do not let your back touch the back of the chair. Your arms should be at your sides with your elbows bent to a 90-degree angle (right angle). Position your forearms so that your thumbs are facing the ceiling (neutral position). Without lifting your shoulders   up, squeeze your shoulder blades tightly together. Hold this position for 10 seconds. Slowly release and return to the starting position. Repeat 10 times. Complete this exercise 1-2 times a day. This information is not intended to replace advice given to you by your health care provider. Make sure you discuss any questions you have with your healthcare provider. Document Revised: 03/16/2019 Document Reviewed: 03/16/2019 Elsevier Patient Education  2022 [employer].  

## 2020-09-28 NOTE — Progress Notes (Signed)
New Patient Visit  Assessment: Marilyn Gomez is a 50 y.o. female with the following: 1. Lateral epicondylitis of right elbow  Plan: Patient has pain in the lateral elbow, consistent with lateral epicondylitis.  This is been ongoing for several months.  It is progressively gotten worse and is particularly bad over the past couple of weeks.  She has tried a counterforce brace.  She has tried medications.  She has adjusted her activities.  She is never had an injection.  After discussing all the potential treatment options, she would like to proceed with an injection.  This was completed in clinic today.  I also provided her with exercises for her to work on on her own.  Otherwise, active activities as tolerated.  Procedure note injection - Right lateral elbow   Verbal consent was obtained to inject the right elbow, common extensor origin Timeout was completed to confirm the site of injection.  The point of maximal tenderness was identified. The skin was prepped with alcohol and ethyl chloride was sprayed at the injection site.  A 21-gauge needle was used to inject 40 mg of Depo-Medrol and 1% lidocaine (3 cc) into the common origin of the extensor tendons, overlying the right lateral epicondyle using a direct lateral approach.  There were no complications.  A sterile bandage was applied.     Follow-up: Return if symptoms worsen or fail to improve.  Subjective:  Chief Complaint  Patient presents with   Elbow Pain    Rt elbow pain for 1.5 months but has gotten worse over past 1.5 wks. Unable to carry anything in hand due to pain     History of Present Illness: Marilyn Gomez is a 50 y.o. female who presents for evaluation of right elbow pain.  She locates the pain directly over the lateral aspect of the right elbow.  Pain has been there for a couple of months.  It has progressively worsened and has been particularly bad for the past 2 weeks.  She states her husband has had issues with  tennis elbow in the past, and she therefore tried a counterforce brace without improvement.  She is taking over-the-counter pain medications.  She has continued with her usual activity, but notes progressive worsening at her Pilates.  She has difficulty gripping objects.  She has been avoiding activities because the pain is constant and severe.  She has tried Voltaren topical gel.  No physical therapy.  No previous injections.   Review of Systems: No fevers or chills No numbness or tingling No chest pain No shortness of breath No bowel or bladder dysfunction No GI distress No headaches   Medical History:  Past Medical History:  Diagnosis Date   Hyperlipemia     Past Surgical History:  Procedure Laterality Date   APPENDECTOMY     BACK SURGERY     COLONOSCOPY WITH PROPOFOL N/A 10/03/2019   Procedure: COLONOSCOPY WITH PROPOFOL;  Surgeon: Eloise Harman, DO;  Location: [facility];  Service: Endoscopy;  Laterality: N/A;  10:30   POLYPECTOMY  10/03/2019   Procedure: POLYPECTOMY;  Surgeon: Eloise Harman, DO;  Location: [facility];  Service: Endoscopy;;   TUBAL LIGATION      No family history on file. Social History   Tobacco Use   Smoking status: Every Day    Types: E-cigarettes   Smokeless tobacco: Never  Vaping Use   Vaping Use: Never used  Substance Use Topics   Alcohol use: Yes    Comment:  rarely    No Known Allergies  No outpatient medications have been marked as taking for the 09/28/20 encounter (Office Visit) with [name], MD.    Objective: BP 113/75   Pulse 66   Ht 5\' 11"  (1.803 m)   Wt 164 lb (74.4 kg)   BMI 22.87 kg/m   Physical Exam:  General: Alert and oriented. and No acute distress. Gait: Normal gait.  Evaluation of the right elbow demonstrates no swelling.  No deformity is appreciated.  She has tenderness to palpation directly over the lateral epicondyle.  Pain is recreated with resisted extension of the long finger.  Pain is  recreated with extension of the wrist.  Fingers warm and well-perfused.  No bruising is appreciated over the lateral elbow.  2+ radial pulse.  IMAGING: I personally ordered and reviewed the following images  X-rays of the right elbow were obtained in clinic today and demonstrates no acute injuries.  Minimal degenerative changes are noted.  No evidence of a previous injury to the radial head.  No fracture or dislocation is identified.  Impression: Normal right elbow x-rays   New Medications:  No orders of the defined types were placed in this encounter.     [name], MD  09/28/2020 12:13 PM

## 2020-10-15 ENCOUNTER — Other Ambulatory Visit: Payer: Self-pay

## 2020-10-15 ENCOUNTER — Encounter (HOSPITAL_COMMUNITY): Payer: Self-pay

## 2020-10-15 ENCOUNTER — Ambulatory Visit (HOSPITAL_COMMUNITY)
Admission: RE | Admit: 2020-10-15 | Discharge: 2020-10-15 | Disposition: A | Discharge status: Home / Self Care | Payer: 59 | Source: Ambulatory Visit | Attending: Family Medicine | Admitting: Family Medicine

## 2020-10-15 DIAGNOSIS — Z1231 Encounter for screening mammogram for malignant neoplasm of breast: Secondary | ICD-10-CM | POA: Diagnosis not present

## 2020-10-26 ENCOUNTER — Other Ambulatory Visit: Payer: Self-pay

## 2020-10-26 ENCOUNTER — Ambulatory Visit (INDEPENDENT_AMBULATORY_CARE_PROVIDER_SITE_OTHER): Payer: 59 | Admitting: Orthopedic Surgery

## 2020-10-26 ENCOUNTER — Encounter: Payer: Self-pay | Admitting: Orthopedic Surgery

## 2020-10-26 VITALS — BP 108/67 | HR 69 | Ht 71.0 in | Wt 166.0 lb

## 2020-10-26 DIAGNOSIS — M25521 Pain in right elbow: Secondary | ICD-10-CM

## 2020-10-26 DIAGNOSIS — M7711 Lateral epicondylitis, right elbow: Secondary | ICD-10-CM

## 2020-10-27 ENCOUNTER — Encounter: Payer: Self-pay | Admitting: Orthopedic Surgery

## 2020-10-27 NOTE — Progress Notes (Signed)
Orthopaedic Clinic Return  Assessment: Marilyn Gomez is a 50 y.o. female with the following: Right lateral epicondylitis; recalcitrant to nonoperative management  Plan: Patient reports she had some improvement in her symptoms following the injection, and the pain is progressively worsening.  She is very limited in her activities.  She is relying more heavily on her left arm as a result.  We discussed further treatment options including PRP injections, more therapy and the possibility of proceeding with surgery.  She states that based on the current trajectory of her pain, and her limitations, she is interested in surgery.  We discussed a couple of different options for surgery, and I would like to obtain an MRI prior to finalizing a date for surgery.  Once the MRIs been scheduled, we can contact the patient to discuss the final plan, as well as finalize a date.  This can be set up as a telephone visit.    Follow-up: Return for After MRI; schedule telphone visit to finalize date for surgery .   Subjective:  Chief Complaint  Patient presents with   Elbow Pain    Pt states injection only helpd for a week and pain is coming back. States it's mainly when she picks up items but it's not as bad as previously     History of Present Illness: Marilyn Gomez is a 50 y.o. female who returns to clinic for repeat evaluation of her right elbow.  She has a long history of right lateral epicondylitis.  She has tried activity modifications, pain medications, bracing and a corticosteroid injection.  At the last visit, we injected her right elbow, and the pain improved for about a week.  However, since then the pain has been progressively worsening.  The pain is affecting her ability to continue with her preferred activities.  She is getting very frustrated.  She finds that even daily activities are difficult, and she relies on her left arm for many activities around the house.  She has previously had a series of  injections for other issues, and she is not interested in trying PRP.  Review of Systems: No fevers or chills No numbness or tingling No chest pain No shortness of breath No bowel or bladder dysfunction No GI distress No headaches   Objective: BP 108/67   Pulse 69   Ht 5\' 11"  (1.803 m)   Wt 166 lb (75.3 kg)   BMI 23.15 kg/m   Physical Exam:  Alert and oriented.  No acute distress.  Evaluation the right elbow demonstrates no deformity.  She has full range of motion of the elbow.  She has tenderness to palpation directly over the lateral epicondyle.  Mild discomfort into the common extensor tendon.  Pain with resisted wrist extension.  Pain with resisted extension of the long finger.  Fingers are warm and well-perfused.  2+ radial pulse.  IMAGING: I personally ordered and reviewed the following images:  No new imaging obtained today.   [name], MD 10/27/2020 8:10 AM

## 2020-10-31 ENCOUNTER — Ambulatory Visit (HOSPITAL_COMMUNITY): Payer: 59

## 2020-11-07 ENCOUNTER — Telehealth: Payer: Self-pay | Admitting: Orthopedic Surgery

## 2020-11-07 ENCOUNTER — Other Ambulatory Visit: Payer: Self-pay

## 2020-11-07 ENCOUNTER — Ambulatory Visit (HOSPITAL_COMMUNITY)
Admission: RE | Admit: 2020-11-07 | Discharge: 2020-11-07 | Disposition: A | Discharge status: Home / Self Care | Payer: 59 | Source: Ambulatory Visit | Attending: Orthopedic Surgery | Admitting: Orthopedic Surgery

## 2020-11-07 DIAGNOSIS — M7711 Lateral epicondylitis, right elbow: Secondary | ICD-10-CM | POA: Insufficient documentation

## 2020-11-07 NOTE — Telephone Encounter (Signed)
Patient called to relay that she has had her MRI done today at [facility] and said that Dr Amedeo Kinsman would be discussing results and treatment plan/surgery by phone. Patientsaid she will be traveling the first of the new year; therefore, would like to move forward with what needs to be done as soon as possible. Please advise.

## 2020-11-08 NOTE — Telephone Encounter (Signed)
Results are now available. Please advise.

## 2020-11-09 NOTE — Telephone Encounter (Signed)
Spoke with pt who would like to proceed with a phone visit on 11/8 and aware the call will be after clinic (around lunch time). Will one of you ladies book this for me. Thank you

## 2020-11-13 ENCOUNTER — Ambulatory Visit (INDEPENDENT_AMBULATORY_CARE_PROVIDER_SITE_OTHER): Payer: 59 | Admitting: Orthopedic Surgery

## 2020-11-13 ENCOUNTER — Encounter: Payer: Self-pay | Admitting: Orthopedic Surgery

## 2020-11-13 DIAGNOSIS — M7711 Lateral epicondylitis, right elbow: Secondary | ICD-10-CM | POA: Diagnosis not present

## 2020-11-13 NOTE — H&P (View-Only) (Signed)
Orthopaedic Clinic Return - Virtual Telephone Visit   **Visit was conducted via telephone at the patient's request.  No vital signs or physical exam were completed.  All previous medical records were readily available during my conversation with the patient.  In total, I was on the phone with the patient for 15 minutes**   Location: Patient: Home Provider: [place] - 11-20 minutes   Assessment: Marilyn Gomez is a 50 y.o. female with the following: Right elbow lateral epicondylitis  Plan: [name] continues to have pain in the lateral elbow.  She has tried medications, bracing, exercises as well as a steroid injection.  MRI demonstrates no tearing of the extensor tendons at the lateral epicondyle.  Nonetheless, her presentation is most consistent with lateral epicondylitis.  I have offered her surgery for debridement of the common extensor tendon.  She is interested in proceeding.  The risks and benefits were discussed with the patient, including, but not limited to, infection, bleeding, persistent pain, recurrence of her pain, stiffness of the elbow and more severe complications associated with anesthesia.  She has elected to proceed.  She like to schedule surgery on 11/26/2020.  This will be an outpatient procedure.   Follow-up: Return for After surgery.   Subjective:  Chief Complaint  Patient presents with   Right elbow pain    Persistent right elbow pain, progressive since steroid injection     History of Present Illness: I connected with  Marilyn Gomez on 11/13/20 by a video enabled telemedicine application and verified that I am speaking with the correct person using two identifiers.   I discussed the limitations of evaluation and management by telemedicine. The patient expressed understanding and agreed to proceed.   Marilyn Gomez is a 50 y.o. female who continues to have pain in the lateral elbow.  She states it is affecting her everyday activities.  She previously  had a right elbow steroid injection, which improved her symptoms for approximately 1 week.  Since then, she has had progressively worsening pain in the same spot.  She states she had to stop typing earlier today, due to the pain in the lateral elbow.  The pain will occasionally wake her up at night.  It has been ongoing for over 6 months, and she has tried multiple treatment options.  None have provided sustained relief.   Review of Systems: No fevers or chills No numbness or tingling No chest pain No shortness of breath No bowel or bladder dysfunction No GI distress No headaches   Objective: No vital signs  Physical Exam:  Telephone consult - No exam   IMAGING: I personally ordered and reviewed the following images:  MRI of the right elbow was obtained, and there is no evidence of a tendinous injury at the right lateral epicondyle.  [name], MD 11/13/2020 12:13 PM

## 2020-11-13 NOTE — Progress Notes (Signed)
[facility] Return - Virtual Telephone Visit   **Visit was conducted via telephone at the patient's request.  No vital signs or physical exam were completed.  All previous medical records were readily available during my conversation with the patient.  In total, I was on the phone with the patient for 15 minutes**   Location: Patient: Home Provider: [facility] - 11-20 minutes   Assessment: Marilyn Gomez is a 50 y.o. female with the following: Right elbow lateral epicondylitis  Plan: Marilyn Gomez continues to have pain in the lateral elbow.  She has tried medications, bracing, exercises as well as a steroid injection.  MRI demonstrates no tearing of the extensor tendons at the lateral epicondyle.  Nonetheless, her presentation is most consistent with lateral epicondylitis.  I have offered her surgery for debridement of the common extensor tendon.  She is interested in proceeding.  The risks and benefits were discussed with the patient, including, but not limited to, infection, bleeding, persistent pain, recurrence of her pain, stiffness of the elbow and more severe complications associated with anesthesia.  She has elected to proceed.  She like to schedule surgery on 11/26/2020.  This will be an outpatient procedure.   Follow-up: Return for After surgery.   Subjective:  Chief Complaint  Patient presents with   Right elbow pain    Persistent right elbow pain, progressive since steroid injection     History of Present Illness: I connected with  Marilyn Gomez on 11/13/20 by a video enabled telemedicine application and verified that I am speaking with the correct person using two identifiers.   I discussed the limitations of evaluation and management by telemedicine. The patient expressed understanding and agreed to proceed.   Marilyn Gomez is a 50 y.o. female who continues to have pain in the lateral elbow.  She states it is affecting her everyday activities.  She previously  had a right elbow steroid injection, which improved her symptoms for approximately 1 week.  Since then, she has had progressively worsening pain in the same spot.  She states she had to stop typing earlier today, due to the pain in the lateral elbow.  The pain will occasionally wake her up at night.  It has been ongoing for over 6 months, and she has tried multiple treatment options.  None have provided sustained relief.   Review of Systems: No fevers or chills No numbness or tingling No chest pain No shortness of breath No bowel or bladder dysfunction No GI distress No headaches   Objective: No vital signs  Physical Exam:  Telephone consult - No exam   IMAGING: I personally ordered and reviewed the following images:  MRI of the right elbow was obtained, and there is no evidence of a tendinous injury at the right lateral epicondyle.  [name], MD 11/13/2020 12:13 PM

## 2020-11-21 NOTE — Patient Instructions (Signed)
Your procedure is scheduled on: 11/26/2020  Report to [facility] at  6:15   AM.  Call this number if you have problems the morning of surgery: [phone]   Remember:   Do not Eat or Drink after midnight         No Smoking the morning of surgery  :  Take these medicines the morning of surgery with A SIP OF WATER: none   Do not wear jewelry, make-up or nail polish.  Do not wear lotions, powders, or perfumes. You may wear deodorant.  Do not shave 48 hours prior to surgery. Men may shave face and neck.  Do not bring valuables to the hospital.  Contacts, dentures or bridgework may not be worn into surgery.  Leave suitcase in the car. After surgery it may be brought to your room.  For patients admitted to the hospital, checkout time is 11:00 AM the day of discharge.   Patients discharged the day of surgery will not be allowed to drive home.    Special Instructions: Shower using CHG night before surgery and shower the day of surgery use CHG.  Use special wash - you have one bottle of CHG for all showers.  You should use approximately 1/2 of the bottle for each shower.  How to Use Chlorhexidine for Bathing Chlorhexidine gluconate (CHG) is a germ-killing (antiseptic) solution that is used to clean the skin. It can get rid of the bacteria that normally live on the skin and can keep them away for about 24 hours. To clean your skin with CHG, you may be given: A CHG solution to use in the shower or as part of a sponge bath. A prepackaged cloth that contains CHG. Cleaning your skin with CHG may help lower the risk for infection: While you are staying in the intensive care unit of the hospital. If you have a vascular access, such as a central line, to provide short-term or long-term access to your veins. If you have a catheter to drain urine from your bladder. If you are on a ventilator. A ventilator is a machine that helps you breathe by moving air in and out of your lungs. After  surgery. What are the risks? Risks of using CHG include: A skin reaction. Hearing loss, if CHG gets in your ears and you have a perforated eardrum. Eye injury, if CHG gets in your eyes and is not rinsed out. The CHG product catching fire. Make sure that you avoid smoking and flames after applying CHG to your skin. Do not use CHG: If you have a chlorhexidine allergy or have previously reacted to chlorhexidine. On babies younger than 37 months of age. How to use CHG solution Use CHG only as told by your health care provider, and follow the instructions on the label. Use the full amount of CHG as directed. Usually, this is one bottle. During a shower Follow these steps when using CHG solution during a shower (unless your health care provider gives you different instructions): Start the shower. Use your normal soap and shampoo to wash your face and hair. Turn off the shower or move out of the shower stream. Pour the CHG onto a clean washcloth. Do not use any type of brush or rough-edged sponge. Starting at your neck, lather your body down to your toes. Make sure you follow these instructions: If you will be having surgery, pay special attention to the part of your body where you will be having surgery. Scrub  this area for at least 1 minute. Do not use CHG on your head or face. If the solution gets into your ears or eyes, rinse them well with water. Avoid your genital area. Avoid any areas of skin that have broken skin, cuts, or scrapes. Scrub your back and under your arms. Make sure to wash skin folds. Let the lather sit on your skin for 1-2 minutes or as long as told by your health care provider. Thoroughly rinse your entire body in the shower. Make sure that all body creases and crevices are rinsed well. Dry off with a clean towel. Do not put any substances on your body afterward--such as powder, lotion, or perfume--unless you are told to do so by your health care provider. Only use lotions  that are recommended by the manufacturer. Put on clean clothes or pajamas. If it is the night before your surgery, sleep in clean sheets.  During a sponge bath Follow these steps when using CHG solution during a sponge bath (unless your health care provider gives you different instructions): Use your normal soap and shampoo to wash your face and hair. Pour the CHG onto a clean washcloth. Starting at your neck, lather your body down to your toes. Make sure you follow these instructions: If you will be having surgery, pay special attention to the part of your body where you will be having surgery. Scrub this area for at least 1 minute. Do not use CHG on your head or face. If the solution gets into your ears or eyes, rinse them well with water. Avoid your genital area. Avoid any areas of skin that have broken skin, cuts, or scrapes. Scrub your back and under your arms. Make sure to wash skin folds. Let the lather sit on your skin for 1-2 minutes or as long as told by your health care provider. Using a different clean, wet washcloth, thoroughly rinse your entire body. Make sure that all body creases and crevices are rinsed well. Dry off with a clean towel. Do not put any substances on your body afterward--such as powder, lotion, or perfume--unless you are told to do so by your health care provider. Only use lotions that are recommended by the manufacturer. Put on clean clothes or pajamas. If it is the night before your surgery, sleep in clean sheets. How to use CHG prepackaged cloths Only use CHG cloths as told by your health care provider, and follow the instructions on the label. Use the CHG cloth on clean, dry skin. Do not use the CHG cloth on your head or face unless your health care provider tells you to. When washing with the CHG cloth: Avoid your genital area. Avoid any areas of skin that have broken skin, cuts, or scrapes. Before surgery Follow these steps when using a CHG cloth to  clean before surgery (unless your health care provider gives you different instructions): Using the CHG cloth, vigorously scrub the part of your body where you will be having surgery. Scrub using a back-and-forth motion for 3 minutes. The area on your body should be completely wet with CHG when you are done scrubbing. Do not rinse. Discard the cloth and let the area air-dry. Do not put any substances on the area afterward, such as powder, lotion, or perfume. Put on clean clothes or pajamas. If it is the night before your surgery, sleep in clean sheets.  For general bathing Follow these steps when using CHG cloths for general bathing (unless your health care provider  gives you different instructions). Use a separate CHG cloth for each area of your body. Make sure you wash between any folds of skin and between your fingers and toes. Wash your body in the following order, switching to a new cloth after each step: The front of your neck, shoulders, and chest. Both of your arms, under your arms, and your hands. Your stomach and groin area, avoiding the genitals. Your right leg and foot. Your left leg and foot. The back of your neck, your back, and your buttocks. Do not rinse. Discard the cloth and let the area air-dry. Do not put any substances on your body afterward--such as powder, lotion, or perfume--unless you are told to do so by your health care provider. Only use lotions that are recommended by the manufacturer. Put on clean clothes or pajamas. Contact a health care provider if: Your skin gets irritated after scrubbing. You have questions about using your solution or cloth. You swallow any chlorhexidine. Call your local poison control center (1-704-309-4311 in the [place] Get help right away if: Your eyes itch badly, or they become very red or swollen. Your skin itches badly and is red or swollen. Your hearing changes. You have trouble seeing. You have swelling or tingling in your mouth or  throat. You have trouble breathing. These symptoms may represent a serious problem that is an emergency. Do not wait to see if the symptoms will go away. Get medical help right away. Call your local emergency services (911 in the [place] Do not drive yourself to the hospital. Summary Chlorhexidine gluconate (CHG) is a germ-killing (antiseptic) solution that is used to clean the skin. Cleaning your skin with CHG may help to lower your risk for infection. You may be given CHG to use for bathing. It may be in a bottle or in a prepackaged cloth to use on your skin. Carefully follow your health care provider's instructions and the instructions on the product label. Do not use CHG if you have a chlorhexidine allergy. Contact your health care provider if your skin gets irritated after scrubbing. This information is not intended to replace advice given to you by your health care provider. Make sure you discuss any questions you have with your health care provider. Document Revised: 03/05/2020 Document Reviewed: 03/05/2020 Elsevier Patient Education  2022"[employer]" Stretch  Sutured Wound Care Sutures are stitches that can be used to close wounds. Some stitches break down as they heal (absorbable). Other stitches need to be taken out by your doctor (nonabsorbable). Taking good care of your wound can help to prevent pain and infection. It can also help your wound heal more quickly. Follow instructions from your doctor about how to care for your sutured wound. Supplies needed: Soap and water. A clean, dry towel. Solution to clean your wound, if needed. A clean gauze or bandage (dressing), if needed. Antibiotic ointment, if told by your doctor. How to care for your sutured wound  Keep the wound fully dry for the first 24 hours or as long as told by your doctor. After 24-48 hours, you may shower or bathe as told by your doctor. Do not soak the wound or put the wound under water until the stitches have  been taken out. After the first 24 hours, clean the wound once a day, or as often as your doctor tells you to. Take these steps: Wash and rinse the wound as told by your health care provider. Pat the wound dry with a clean towel. Do  not rub the wound. After cleaning the wound, put a thin layer of antibiotic ointment on the wound as told by your doctor. This will help: Prevent infection. Keep the bandage from sticking to the wound. Follow instructions from your doctor about how to change your bandage. Make sure you: Wash your hands with soap and water for at least 20 seconds. If you cannot use soap and water, use hand sanitizer. Change your bandage at least once a day, or as often as told by your doctor. If your dressing gets wet or dirty, change it. Leavestitches in place for at least 2 weeks. If you have skin glue over your stitches, this should also stay in place for at least 2 weeks. Leave tape strips alone (if you have them) unless you are told to take them off. You may trim the edges of the tape strips if they curl up. Check your wound every day for signs of infection. Watch for: Redness, swelling, or pain. Fluid or blood. New warmth, a rash, or hardness at the wound site. Pus or a bad smell. Have the stitches taken out as told by your doctor. Follow these instructions at home: Medicines Take or apply over-the-counter and prescription medicines only as told by your doctor. If you were prescribed an antibiotic medicine or ointment, take or apply it as told by your doctor. Do not stop using the antibiotic even if you start to feel better. General instructions Cover your wound with clothes or put sunscreen on when you are outside. Use a sunscreen of at least 30 SPF. Do not scratch or pick at your wound. Avoid stretching your wound. Raise the injured area above the level of your heart while you are sitting or lying down, if possible. Eat a diet that includes protein, vitamin A, and  vitamin C. Doing this will help your wound heal. Drink enough fluid to keep your pee (urine) pale yellow. Keep all follow-up visits. Contact a doctor if: You were given a tetanus shot and you have any of the following at the site where the needle went in: Swelling. Very bad pain. Redness. Bleeding. Your wound breaks open. You see something coming out of your wound, such as wood or glass. You have any of these signs of infection in or around your wound: Redness, swelling, or pain. Fluid or blood. Warmth. A new rash. Your wound feels hard. You have a fever. The skin near your wound changes color. You have pain that does not get better with medicine. You get numbness around the wound. Get help right away if: You have very bad swelling or more pain around your wound. You have pus or a bad smell coming from your wound. You have painful lumps near your wound or anywhere on your body. You have a red streak going away from your wound. The wound is on your hand or foot, and: Your fingers or toes look pale or blue. You cannot move a finger or toe as you used to do. You have numbness that spreads down your hand, foot, fingers, or toes. Summary Sutures are stitches that are used to close wounds. Taking good care of your wound can help to prevent pain and infection. Keep the wound fully dry for the first 24 hours or for as long as told by your doctor. After 24-48 hours, you may shower or bathe as told by your doctor. This information is not intended to replace advice given to you by your health care provider. Make sure you  discuss any questions you have with your health care provider. Document Revised: 04/30/2020 Document Reviewed: 04/30/2020 Elsevier Patient Education  2022 [place] Anesthesia, Adult, Care After This sheet gives you information about how to care for yourself after your procedure. Your health care provider may also give you more specific instructions. If you  have problems or questions, contact your health care provider. What can I expect after the procedure? After the procedure, the following side effects are common: Pain or discomfort at the IV site. Nausea. Vomiting. Sore throat. Trouble concentrating. Feeling cold or chills. Feeling weak or tired. Sleepiness and fatigue. Soreness and body aches. These side effects can affect parts of the body that were not involved in surgery. Follow these instructions at home: For the time period you were told by your health care provider:  Rest. Do not participate in activities where you could fall or become injured. Do not drive or use machinery. Do not drink alcohol. Do not take sleeping pills or medicines that cause drowsiness. Do not make important decisions or sign legal documents. Do not take care of children on your own. Eating and drinking Follow any instructions from your health care provider about eating or drinking restrictions. When you feel hungry, start by eating small amounts of foods that are soft and easy to digest (bland), such as toast. Gradually return to your regular diet. Drink enough fluid to keep your urine pale yellow. If you vomit, rehydrate by drinking water, juice, or clear broth. General instructions If you have sleep apnea, surgery and certain medicines can increase your risk for breathing problems. Follow instructions from your health care provider about wearing your sleep device: Anytime you are sleeping, including during daytime naps. While taking prescription pain medicines, sleeping medicines, or medicines that make you drowsy. Have a responsible adult stay with you for the time you are told. It is important to have someone help care for you until you are awake and alert. Return to your normal activities as told by your health care provider. Ask your health care provider what activities are safe for you. Take over-the-counter and prescription medicines only as told  by your health care provider. If you smoke, do not smoke without supervision. Keep all follow-up visits as told by your health care provider. This is important. Contact a health care provider if: You have nausea or vomiting that does not get better with medicine. You cannot eat or drink without vomiting. You have pain that does not get better with medicine. You are unable to pass urine. You develop a skin rash. You have a fever. You have redness around your IV site that gets worse. Get help right away if: You have difficulty breathing. You have chest pain. You have blood in your urine or stool, or you vomit blood. Summary After the procedure, it is common to have a sore throat or nausea. It is also common to feel tired. Have a responsible adult stay with you for the time you are told. It is important to have someone help care for you until you are awake and alert. When you feel hungry, start by eating small amounts of foods that are soft and easy to digest (bland), such as toast. Gradually return to your regular diet. Drink enough fluid to keep your urine pale yellow. Return to your normal activities as told by your health care provider. Ask your health care provider what activities are safe for you. This information is not intended to replace advice  given to you by your health care provider. Make sure you discuss any questions you have with your health care provider. Document Revised: 09/08/2019 Document Reviewed: 04/07/2019 Elsevier Patient Education  2022 [employer].

## 2020-11-22 ENCOUNTER — Other Ambulatory Visit: Payer: Self-pay

## 2020-11-22 ENCOUNTER — Encounter (HOSPITAL_COMMUNITY)
Admission: RE | Admit: 2020-11-22 | Discharge: 2020-11-22 | Disposition: A | Discharge status: Home / Self Care | Payer: 59 | Source: Ambulatory Visit | Attending: Orthopedic Surgery | Admitting: Orthopedic Surgery

## 2020-11-22 ENCOUNTER — Encounter (HOSPITAL_COMMUNITY): Payer: Self-pay

## 2020-11-22 VITALS — BP 111/71 | HR 75 | Temp 98.6°F | Resp 18 | Ht 72.0 in | Wt 160.0 lb

## 2020-11-22 DIAGNOSIS — M7711 Lateral epicondylitis, right elbow: Secondary | ICD-10-CM | POA: Diagnosis not present

## 2020-11-22 DIAGNOSIS — Z01812 Encounter for preprocedural laboratory examination: Secondary | ICD-10-CM | POA: Diagnosis not present

## 2020-11-22 DIAGNOSIS — Z01818 Encounter for other preprocedural examination: Secondary | ICD-10-CM

## 2020-11-22 LAB — BASIC METABOLIC PANEL
Anion gap: 7 (ref 5–15)
BUN: 20 mg/dL (ref 6–20)
CO2: 25 mmol/L (ref 22–32)
Calcium: 9.1 mg/dL (ref 8.9–10.3)
Chloride: 105 mmol/L (ref 98–111)
Creatinine, Ser: 0.8 mg/dL (ref 0.44–1.00)
GFR, Estimated: >60 mL/min (ref >60)
Glucose, Bld: 81 mg/dL (ref 70–99)
Potassium: 4.5 mmol/L (ref 3.5–5.1)
Sodium: 137 mmol/L (ref 135–145)

## 2020-11-22 LAB — CBC
HCT: 41.3 % (ref 36.0–46.0)
Hemoglobin: 13.5 g/dL (ref 12.0–15.0)
MCH: 30.5 pg (ref 26.0–34.0)
MCHC: 32.7 g/dL (ref 30.0–36.0)
MCV: 93.4 fL (ref 80.0–100.0)
Platelets: 274 10*3/uL (ref 150–400)
RBC: 4.42 MIL/uL (ref 3.87–5.11)
RDW: 12.8 % (ref 11.5–15.5)
WBC: 5.8 10*3/uL (ref 4.0–10.5)
nRBC: 0 % (ref 0.0–0.2)

## 2020-11-22 LAB — HCG, SERUM, QUALITATIVE: Preg, Serum: NEGATIVE

## 2020-11-26 ENCOUNTER — Ambulatory Visit (HOSPITAL_COMMUNITY): Payer: 59 | Admitting: Anesthesiology

## 2020-11-26 ENCOUNTER — Ambulatory Visit (HOSPITAL_COMMUNITY)
Admission: RE | Admit: 2020-11-26 | Discharge: 2020-11-26 | Disposition: A | Discharge status: Home / Self Care | Payer: 59 | Source: Ambulatory Visit | Attending: Orthopedic Surgery | Admitting: Orthopedic Surgery

## 2020-11-26 ENCOUNTER — Encounter (HOSPITAL_COMMUNITY): Payer: Self-pay | Admitting: Orthopedic Surgery

## 2020-11-26 ENCOUNTER — Encounter (HOSPITAL_COMMUNITY)
Admission: RE | Disposition: A | Discharge status: Home / Self Care | Payer: Self-pay | Source: Ambulatory Visit | Attending: Orthopedic Surgery

## 2020-11-26 DIAGNOSIS — Z87891 Personal history of nicotine dependence: Secondary | ICD-10-CM | POA: Insufficient documentation

## 2020-11-26 DIAGNOSIS — M7711 Lateral epicondylitis, right elbow: Secondary | ICD-10-CM | POA: Diagnosis not present

## 2020-11-26 HISTORY — PX: TENNIS ELBOW RELEASE/NIRSCHEL PROCEDURE: SHX6651

## 2020-11-26 SURGERY — TENNIS ELBOW RELEASE/NIRSCHEL PROCEDURE
Anesthesia: General | Site: Elbow | Laterality: Right

## 2020-11-26 MED ORDER — DOCUSATE SODIUM 100 MG PO CAPS: 100.0000 mg | ORAL_CAPSULE | Freq: Every day | ORAL | 1 refills | Status: AC | PRN

## 2020-11-26 MED ORDER — HYDROMORPHONE HCL 1 MG/ML IJ SOLN
0.2500 mg | INTRAMUSCULAR | Status: DC | PRN
  Administered 2020-11-26 (×2): 0.25 mg via INTRAVENOUS

## 2020-11-26 MED ORDER — KETOROLAC TROMETHAMINE 30 MG/ML IJ SOLN
INTRAMUSCULAR | Status: DC | PRN
  Administered 2020-11-26: 30 mg via INTRAVENOUS

## 2020-11-26 MED ORDER — MIDAZOLAM HCL 2 MG/2ML IJ SOLN
INTRAMUSCULAR | Status: DC | PRN
  Administered 2020-11-26: 2 mg via INTRAVENOUS

## 2020-11-26 MED ORDER — KETOROLAC TROMETHAMINE 30 MG/ML IJ SOLN
INTRAMUSCULAR | Status: AC
  Filled 2020-11-26: qty 1

## 2020-11-26 MED ORDER — ONDANSETRON HCL 4 MG PO TABS: 4.0000 mg | ORAL_TABLET | Freq: Three times a day (TID) | ORAL | 0 refills | Status: AC | PRN

## 2020-11-26 MED ORDER — ONDANSETRON HCL 4 MG/2ML IJ SOLN
INTRAMUSCULAR | Status: AC
  Filled 2020-11-26: qty 2

## 2020-11-26 MED ORDER — CELECOXIB 100 MG PO CAPS: 100.0000 mg | ORAL_CAPSULE | Freq: Every day | ORAL | 0 refills | Status: AC

## 2020-11-26 MED ORDER — FENTANYL CITRATE (PF) 250 MCG/5ML IJ SOLN
INTRAMUSCULAR | Status: DC | PRN
  Administered 2020-11-26 (×2): 25 ug via INTRAVENOUS
  Administered 2020-11-26: 50 ug via INTRAVENOUS

## 2020-11-26 MED ORDER — DEXAMETHASONE SODIUM PHOSPHATE 10 MG/ML IJ SOLN
INTRAMUSCULAR | Status: DC | PRN
Start: 2020-11-26 — End: 2020-11-26
  Administered 2020-11-26: 10 mg via INTRAVENOUS

## 2020-11-26 MED ORDER — HYDROCORTISONE 2 % EX LOTN: 3.0000 mL | TOPICAL_LOTION | Freq: Three times a day (TID) | CUTANEOUS | 0 refills | Status: DC | PRN

## 2020-11-26 MED ORDER — LACTATED RINGERS IV SOLN
INTRAVENOUS | Status: DC
  Administered 2020-11-26: 1000 mL via INTRAVENOUS

## 2020-11-26 MED ORDER — LIDOCAINE HCL (CARDIAC) PF 100 MG/5ML IV SOSY
PREFILLED_SYRINGE | INTRAVENOUS | Status: DC | PRN
Start: 2020-11-26 — End: 2020-11-26
  Administered 2020-11-26: 60 mg via INTRAVENOUS

## 2020-11-26 MED ORDER — HYDROMORPHONE HCL 1 MG/ML IJ SOLN
INTRAMUSCULAR | Status: AC
  Filled 2020-11-26: qty 0.5

## 2020-11-26 MED ORDER — MIDAZOLAM HCL 2 MG/2ML IJ SOLN
INTRAMUSCULAR | Status: AC
  Filled 2020-11-26: qty 2

## 2020-11-26 MED ORDER — CHLORHEXIDINE GLUCONATE 0.12 % MT SOLN
15.0000 mL | Freq: Once | OROMUCOSAL | Status: AC
  Administered 2020-11-26: 15 mL via OROMUCOSAL

## 2020-11-26 MED ORDER — ORAL CARE MOUTH RINSE: 15.0000 mL | Freq: Once | OROMUCOSAL | Status: AC

## 2020-11-26 MED ORDER — PROPOFOL 10 MG/ML IV BOLUS
INTRAVENOUS | Status: DC | PRN
  Administered 2020-11-26: 180 mg via INTRAVENOUS

## 2020-11-26 MED ORDER — SODIUM CHLORIDE 0.9 % IR SOLN
Status: DC | PRN
  Administered 2020-11-26: 1000 mL

## 2020-11-26 MED ORDER — ONDANSETRON HCL 4 MG/2ML IJ SOLN
INTRAMUSCULAR | Status: DC | PRN
  Administered 2020-11-26: 4 mg via INTRAVENOUS

## 2020-11-26 MED ORDER — ONDANSETRON HCL 4 MG/2ML IJ SOLN: 4.0000 mg | Freq: Once | INTRAMUSCULAR | Status: DC | PRN

## 2020-11-26 MED ORDER — BUPIVACAINE-EPINEPHRINE 0.5% -1:200000 IJ SOLN
INTRAMUSCULAR | Status: DC | PRN
  Administered 2020-11-26: 10 mL

## 2020-11-26 MED ORDER — DEXAMETHASONE SODIUM PHOSPHATE 10 MG/ML IJ SOLN
INTRAMUSCULAR | Status: AC
  Filled 2020-11-26: qty 1

## 2020-11-26 MED ORDER — CEFAZOLIN SODIUM-DEXTROSE 2-4 GM/100ML-% IV SOLN
INTRAVENOUS | Status: AC
  Filled 2020-11-26: qty 100

## 2020-11-26 MED ORDER — CHLORHEXIDINE GLUCONATE 0.12 % MT SOLN
OROMUCOSAL | Status: AC
  Filled 2020-11-26: qty 15

## 2020-11-26 MED ORDER — PROPOFOL 10 MG/ML IV BOLUS
INTRAVENOUS | Status: AC
  Filled 2020-11-26: qty 40

## 2020-11-26 MED ORDER — FENTANYL CITRATE (PF) 100 MCG/2ML IJ SOLN
INTRAMUSCULAR | Status: AC
  Filled 2020-11-26: qty 2

## 2020-11-26 MED ORDER — HYDROCODONE-ACETAMINOPHEN 5-325 MG PO TABS: 1.0000 | ORAL_TABLET | Freq: Four times a day (QID) | ORAL | 0 refills | Status: AC | PRN

## 2020-11-26 MED ORDER — MEPERIDINE HCL 50 MG/ML IJ SOLN: 6.2500 mg | INTRAMUSCULAR | Status: DC | PRN

## 2020-11-26 MED ORDER — CEFAZOLIN SODIUM-DEXTROSE 2-4 GM/100ML-% IV SOLN
2.0000 g | INTRAVENOUS | Status: AC
  Administered 2020-11-26: 2 g via INTRAVENOUS

## 2020-11-26 MED ORDER — BUPIVACAINE-EPINEPHRINE (PF) 0.5% -1:200000 IJ SOLN
INTRAMUSCULAR | Status: AC
  Filled 2020-11-26: qty 30

## 2020-11-26 MED ORDER — LIDOCAINE HCL (PF) 2 % IJ SOLN
INTRAMUSCULAR | Status: AC
  Filled 2020-11-26: qty 5

## 2020-11-26 MED ORDER — SEVOFLURANE IN SOLN
RESPIRATORY_TRACT | Status: AC
  Filled 2020-11-26: qty 250

## 2020-11-26 SURGICAL SUPPLY — 48 items
APL PRP STRL LF DISP 70% ISPRP (MISCELLANEOUS) ×1
BANDAGE ELASTIC 4 VELCRO NS (GAUZE/BANDAGES/DRESSINGS) ×2 IMPLANT
BANDAGE ESMARK 4X12 BL STRL LF (DISPOSABLE) ×1 IMPLANT
BNDG CMPR 12X4 ELC STRL LF (DISPOSABLE) ×1
BNDG CMPR STD VLCR NS LF 5.8X4 (GAUZE/BANDAGES/DRESSINGS) ×1
BNDG COHESIVE 4X5 TAN ST LF (GAUZE/BANDAGES/DRESSINGS) ×2 IMPLANT
BNDG ELASTIC 4X5.8 VLCR NS LF (GAUZE/BANDAGES/DRESSINGS) ×2 IMPLANT
BNDG ESMARK 4X12 BLUE STRL LF (DISPOSABLE) ×2
CHLORAPREP W/TINT 26 (MISCELLANEOUS) ×2 IMPLANT
CLOSURE STERI STRIP 1/2 X4 (GAUZE/BANDAGES/DRESSINGS) ×2 IMPLANT
CLOTH BEACON ORANGE TIMEOUT ST (SAFETY) ×2 IMPLANT
CORD BIPOLAR FORCEPS 12FT (ELECTRODE) ×2 IMPLANT
COVER LIGHT HANDLE STERIS (MISCELLANEOUS) ×4 IMPLANT
CUFF TOURN SGL QUICK 18X4 (TOURNIQUET CUFF) ×2 IMPLANT
DRAPE HALF SHEET 40X57 (DRAPES) ×2 IMPLANT
DRSG AQUACEL AG ADV 3.5X10 (GAUZE/BANDAGES/DRESSINGS) ×2 IMPLANT
DRSG TEGADERM 4X4.75 (GAUZE/BANDAGES/DRESSINGS) ×2 IMPLANT
ELECT REM PT RETURN 9FT ADLT (ELECTROSURGICAL) ×2
ELECTRODE REM PT RTRN 9FT ADLT (ELECTROSURGICAL) ×1 IMPLANT
GLOVE SRG 8 PF TXTR STRL LF DI (GLOVE) ×1 IMPLANT
GLOVE SURG POLYISO LF SZ8 (GLOVE) ×6 IMPLANT
GLOVE SURG UNDER POLY LF SZ7 (GLOVE) ×6 IMPLANT
GLOVE SURG UNDER POLY LF SZ8 (GLOVE) ×2
GOWN STRL REUS W/ TWL XL LVL3 (GOWN DISPOSABLE) ×1 IMPLANT
GOWN STRL REUS W/TWL LRG LVL3 (GOWN DISPOSABLE) ×4 IMPLANT
GOWN STRL REUS W/TWL XL LVL3 (GOWN DISPOSABLE) ×2
INST SET MINOR BONE (KITS) ×2 IMPLANT
KIT TURNOVER KIT A (KITS) ×2 IMPLANT
MANIFOLD NEPTUNE II (INSTRUMENTS) ×2 IMPLANT
NEEDLE HYPO 21X1.5 SAFETY (NEEDLE) ×2 IMPLANT
NS IRRIG 1000ML POUR BTL (IV SOLUTION) ×2 IMPLANT
PACK BASIC LIMB (CUSTOM PROCEDURE TRAY) ×2 IMPLANT
PAD ARMBOARD 7.5X6 YLW CONV (MISCELLANEOUS) ×2 IMPLANT
PAD CAST 4YDX4 CTTN HI CHSV (CAST SUPPLIES) IMPLANT
PADDING CAST COTTON 4X4 STRL (CAST SUPPLIES)
SET BASIN LINEN APH (SET/KITS/TRAYS/PACK) ×2 IMPLANT
SLING ARM FOAM STRAP LRG (SOFTGOODS) ×2 IMPLANT
SPLINT IMMOBILIZER J 3INX20FT (CAST SUPPLIES)
SPLINT J IMMOBILIZER 3X20FT (CAST SUPPLIES) IMPLANT
SPLINT WRIST DYN CLOSE UNIV (SOFTGOODS) ×2 IMPLANT
SPONGE T-LAP 18X18 ~~LOC~~+RFID (SPONGE) ×2 IMPLANT
SUT MNCRL AB 4-0 PS2 18 (SUTURE) ×2 IMPLANT
SUT MON AB 2-0 SH 27 (SUTURE) ×2
SUT MON AB 2-0 SH27 (SUTURE) ×1 IMPLANT
SUT VIC AB 2-0 CT1 27 (SUTURE) ×2
SUT VIC AB 2-0 CT1 TAPERPNT 27 (SUTURE) ×1 IMPLANT
SUT VICRYL AB 3-0 FS1 BRD 27IN (SUTURE) IMPLANT
SYR CONTROL 10ML LL (SYRINGE) ×2 IMPLANT

## 2020-11-26 NOTE — Anesthesia Postprocedure Evaluation (Signed)
Anesthesia Post Note  Patient: Marilyn Gomez  Procedure(s) Performed: TENNIS ELBOW RELEASE/NIRSCHEL PROCEDURE (Right: Elbow)  Patient location during evaluation: PACU Anesthesia Type: General Level of consciousness: awake and alert and oriented Pain management: pain level controlled Vital Signs Assessment: post-procedure vital signs reviewed and stable Respiratory status: spontaneous breathing, nonlabored ventilation and respiratory function stable Cardiovascular status: blood pressure returned to baseline and stable Postop Assessment: no apparent nausea or vomiting Anesthetic complications: no   No notable events documented.   Last Vitals:  Vitals:   11/26/20 0915 11/26/20 0939  BP: 93/63 105/72  Pulse: (!) 58 (!) 55  Resp: 12 14  Temp:  36.6 C  SpO2: 96% 99%    Last Pain:  Vitals:   11/26/20 0939  TempSrc: Oral  PainSc: 3                  Rowena Moilanen C Sammie Denner

## 2020-11-26 NOTE — Anesthesia Procedure Notes (Signed)
Procedure Name: LMA Insertion Date/Time: 11/26/2020 7:32 AM Performed by: Karna Dupes, CRNA Pre-anesthesia Checklist: Patient identified, Emergency Drugs available, Suction available and Patient being monitored Patient Re-evaluated:Patient Re-evaluated prior to induction Oxygen Delivery Method: Circle system utilized Preoxygenation: Pre-oxygenation with 100% oxygen Induction Type: IV induction LMA: LMA inserted LMA Size: 4.0 Number of attempts: 1 Placement Confirmation: breath sounds checked- equal and bilateral and positive ETCO2 Tube secured with: Tape Dental Injury: Teeth and Oropharynx as per pre-operative assessment

## 2020-11-26 NOTE — Interval H&P Note (Signed)
History and Physical Interval Note:  11/26/2020 7:16 AM  Marilyn Gomez  has presented today for surgery, with the diagnosis of Right lateral epicondylitis.  The various methods of treatment have been discussed with the patient and family. After consideration of risks, benefits and other options for treatment, the patient has consented to  Procedure(s): TENNIS ELBOW RELEASE/NIRSCHEL PROCEDURE (Right) as a surgical intervention.  The patient's history has been reviewed, patient examined, no change in status, stable for surgery.  I have reviewed the patient's chart and labs.  Questions were answered to the patient's satisfaction.     [name]

## 2020-11-26 NOTE — Anesthesia Preprocedure Evaluation (Signed)
Anesthesia Evaluation  Patient identified by MRN, date of birth, ID band Patient awake    Reviewed: Allergy & Precautions, H&P , NPO status , Patient's Chart, lab work & pertinent test results  Airway Mallampati: II  TM Distance: >3 FB Neck ROM: Full    Dental  (+) Caps, Dental Advisory Given,    Pulmonary former smoker,    Pulmonary exam normal breath sounds clear to auscultation       Cardiovascular negative cardio ROS Normal cardiovascular exam Rhythm:Regular Rate:Normal     Neuro/Psych negative neurological ROS  negative psych ROS   GI/Hepatic negative GI ROS, Neg liver ROS,   Endo/Other  negative endocrine ROS  Renal/GU negative Renal ROS  negative genitourinary   Musculoskeletal negative musculoskeletal ROS (+)   Abdominal   Peds negative pediatric ROS (+)  Hematology negative hematology ROS (+)   Anesthesia Other Findings Back sx  Reproductive/Obstetrics negative OB ROS                             Anesthesia Physical Anesthesia Plan  ASA: 2  Anesthesia Plan: General   Post-op Pain Management: Dilaudid IV   Induction: Intravenous  PONV Risk Score and Plan: Ondansetron and Dexamethasone  Airway Management Planned: LMA  Additional Equipment:   Intra-op Plan:   Post-operative Plan: Extubation in OR  Informed Consent: I have reviewed the patients History and Physical, chart, labs and discussed the procedure including the risks, benefits and alternatives for the proposed anesthesia with the patient or authorized representative who has indicated his/her understanding and acceptance.     Dental advisory given  Plan Discussed with: CRNA and Surgeon  Anesthesia Plan Comments:         Anesthesia Quick Evaluation

## 2020-11-26 NOTE — Op Note (Signed)
Orthopaedic Surgery Operative Note (CSN: [id number])  Marilyn Gomez  05/03/1970 Date of Surgery: 11/26/2020   Diagnoses:  Right lateral epicondylitis  Procedure: Right elbow, extensor tendon tenotomy   Operative Finding Successful completion of the planned procedure.  Degenerative tendon tissue debrided at the right lateral epicondyle.  No complications   Post-Op Diagnosis: Same Surgeons:Primary: [name], MD Assistants: None Location: [facility] OR ROOM 2 Anesthesia: General with local anesthesia Antibiotics: Ancef 2 g Tourniquet time:  Total Tourniquet Time Documented: Upper Arm (Right) - 41 minutes Total: Upper Arm (Right) - 41 minutes  Estimated Blood Loss: Minimal Complications: None Specimens: None Implants: * No implants in log *  Indications for Surgery:   Marilyn Gomez is a 50 y.o. female with chronic lateral elbow pain.  She has tried exercises, bracing, medications and an injection which provided significant improvement in her symptoms, but these were not sustained.  After discussing all of her potential treatment options she was interested in surgery.  Benefits and risks of operative and nonoperative management were discussed prior to surgery with the patient and informed consent form was completed.  Specific risks including infection, need for additional surgery, bleeding, persistent pain, stiffness and more severe complications associated with anesthesia.  Surgical consent was completed.  She elected to proceed.   Procedure:   The patient was identified properly. Informed consent was obtained and the surgical site was marked. The patient was taken to the OR where general anesthesia was induced.  The patient was positioned supine on a hand table.  The right arm was prepped and draped in the usual sterile fashion.  Timeout was performed before the beginning of the case.  Tourniquet was used for the above duration.  She received 2 g of Ancef prior to making  incision.  We made an incision directly over the right lateral epicondyle.  We incised sharply through skin.  We used blunt dissection to the surface of the fascia.  An incision was then centered directly over the lateral epicondyle once again.  We incised sharply through the fascia.  The underlying tendon was identified, and cleaned with a sponge.  We then incised sharply through the overlying tendon, to expose the underlying EDB tendon.  We noted that the underlying tendon at its origin was degenerative.  This was incised sharply with a knife, directly off of the lateral epicondyle.  All degenerative tendinous tissue was subsequently incised.  We then used a rongeur and curette to debride the bone, and smooth out the lateral epicondyle.  We noted bleeding bone at the origin of the common extensor tendon.  We then irrigated the debrided bone, and once again noted bleeding bone.  We irrigated the wound copiously.  The tendon was then closed with 2-0 Vicryl, followed by the fascia, once again with 2-0 Vicryl. We closed the incision in a multilayer fashion with absorbable suture.  Sterile dressing was placed.  Patient was awoken taken to PACU in stable condition.    Post-operative plan:  The patient will be nonweightbearing on the right upper extremity.  She is in a sling and a wrist brace.  Limited range of motion.  No lifting anything heavier than a coffee cup.   DVT prophylaxis not indicated in this ambulatory upper extremity patient without significant risk factors.    Pain control with PRN pain medication preferring oral medicines.   Follow up plan will be scheduled in approximately 10-14 days for incision check and XR.

## 2020-11-26 NOTE — Transfer of Care (Signed)
Immediate Anesthesia Transfer of Care Note  Patient: Marilyn Gomez  Procedure(s) Performed: TENNIS ELBOW RELEASE/NIRSCHEL PROCEDURE (Right: Elbow)  Patient Location: PACU  Anesthesia Type:General  Level of Consciousness: drowsy  Airway & Oxygen Therapy: Patient Spontanous Breathing and Patient connected to face mask oxygen  Post-op Assessment: Report given to RN and Post -op Vital signs reviewed and stable  Post vital signs: Reviewed and stable  Last Vitals:  Vitals Value Taken Time  BP    Temp    Pulse    Resp    SpO2      Last Pain:  Vitals:   11/26/20 0650  TempSrc: Oral  PainSc: 5       Patients Stated Pain Goal: 8 (95/07/22 5750)  Complications: No notable events documented.

## 2020-11-26 NOTE — Discharge Instructions (Signed)
  Marilyn Packham A. Amedeo Kinsman, MD [facility] [address] [place],  [place]  [address] Phone: [phone] Fax: [phone]    Ismay may remove your ACE wrap on postop day 3 and shower.   Leave the bandage underneath in place, the dressing is waterproof. No ointments or lotions to be applied to the incision.  Do not submerge the incision for 1 month.  SLING Wear the sling and wrist brace at all times until your follow up appointment It is ok to remove for hygiene Limit range of motion of the wrist and elbow Ok to perform your usual daily activities Do not lift anything heavier than a coffee cup until you are seen in clinic  MEDICATIONS Celebrex - to be taken on a regular basis for 2 weeks; do not take any additional naproxen or ibuprofen Hydrocodone - strong narcotic pain medication, to be taken only as needed.  This medicine contains Tylenol/acetaminophen, please do not take any additional Tylenol/acetaminophen while you are taking this medication Zofran - as needed for nausea or vomiting Colace - stool softener, recommended while taking the narcotic medication Hydrocortisone cream - to be used as needed for issues related to constipation/hemorrhoids    FOLLOW-UP If you develop a Fever (>101.5), Redness or Drainage from the surgical incision site, please call our office to arrange for an evaluation. Please call the office to schedule a follow-up appointment for your incision check if you do not already have one, 7-10 days post-operatively.  IF YOU HAVE ANY QUESTIONS, PLEASE FEEL FREE TO CALL OUR OFFICE.  HELPFUL INFORMATION  You should wean off your narcotic medicines as soon as you are able.  Most patients will be off or using minimal narcotics before their first postop appointment.   You may be more comfortable sleeping in a semi-seated position the first few nights following surgery.  Keep a pillow propped under  the elbow and forearm for comfort.  If you have a recliner type of chair it might be beneficial.    We suggest you use the pain medication the first night prior to going to bed, in order to ease any pain when the anesthesia wears off. You should avoid taking pain medications on an empty stomach as it will make you nauseous.  Do not drink alcoholic beverages or take illicit drugs when taking pain medications.  You may return to work/school in the next couple of days when you feel up to it. Desk work and typing is fine.  Pain medication may make you constipated.  Below are a few solutions to try in this order: Decrease the amount of pain medication if you aren't having pain. Drink lots of decaffeinated fluids. Drink prune juice and/or each dried prunes  If the first 3 don't work start with additional solutions Take Colace - an over-the-counter stool softener Take Senokot - an over-the-counter laxative Take Miralax - a stronger over-the-counter laxative

## 2020-11-27 ENCOUNTER — Encounter (HOSPITAL_COMMUNITY): Payer: Self-pay | Admitting: Orthopedic Surgery

## 2020-11-27 ENCOUNTER — Telehealth: Payer: Self-pay | Admitting: Orthopedic Surgery

## 2020-11-27 NOTE — Telephone Encounter (Signed)
Jonelle with [facility] [place] called and states the script that was called in  yesterday Hydrocortisone 2% lotion.  It is not made in 2% it is only made in 2.5%.  She wants to know if this is ok.  Please call her back at [phone]

## 2020-11-28 NOTE — Telephone Encounter (Signed)
Information given to [place] at [facility].

## 2020-12-07 ENCOUNTER — Encounter: Payer: Self-pay | Admitting: Orthopedic Surgery

## 2020-12-07 ENCOUNTER — Other Ambulatory Visit: Payer: Self-pay

## 2020-12-07 ENCOUNTER — Ambulatory Visit (INDEPENDENT_AMBULATORY_CARE_PROVIDER_SITE_OTHER): Payer: 59 | Admitting: Orthopedic Surgery

## 2020-12-07 VITALS — Ht 72.0 in | Wt 160.0 lb

## 2020-12-07 DIAGNOSIS — M7711 Lateral epicondylitis, right elbow: Secondary | ICD-10-CM

## 2020-12-07 NOTE — Progress Notes (Signed)
Orthopaedic Postop Note  Assessment: Marilyn Gomez is a 50 y.o. female s/p right lateral elbow tenotomy for tennis elbow  DOS: 11/26/2020  Plan: Sutures were trimmed, and Steri-Strips were placed. Procedure was reviewed with the patient. Continue with medications as needed. Okay to come out of the sling, and start working on elbow range of motion. She should continue to wear the wrist extension brace for an additional 2 weeks. In 2 weeks, she can start to increase range of motion activities, with limited lifting. Ideally, I would like for her to return to full range of motion before we initiate any strengthening activities. Okay to continue with activities of daily living.   Follow-up in approximately 1 month.   Follow-up: Return in about 1 month (around 01/09/2021). XR at next visit: None  Subjective:  Chief Complaint  Patient presents with   Routine Post Op    Rt elbow DOS 11/26/20    History of Present Illness: Marilyn Gomez is a 50 y.o. female who presents following the above stated procedure.  Surgery was approximately 2 weeks ago.  She is doing very well.  She required pain medicines for a couple of days after surgery, but is no longer taking any medications.  She notes some soreness at the lateral elbow, extending into the proximal forearm.  She is tolerated the sling, and the wrist brace.  She is starting to note some stiffness in the right elbow.  No issues with constipation.  Review of Systems: No fevers or chills No numbness or tingling No Chest Pain No shortness of breath   Objective: Ht 6' (1.829 m)   Wt 160 lb (72.6 kg)   BMI 21.70 kg/m   Physical Exam:  Alert and oriented.  No acute distress.  Evaluation of the surgical incision demonstrates that it is healing well.  No surrounding erythema or drainage.  There is some residual ecchymosis.  Mild tenderness to palpation within the common extensor muscles.  Fingers are warm and well-perfused.  Active  motions intact in the AIN/PIN/U nerve distribution.  2+ radial pulse.  Range of motion from 40 to 120 degrees.  Stiffness with extension beyond 40 degrees.  IMAGING: I personally ordered and reviewed the following images:  No new imaging obtained today.  [name], MD 12/07/2020 10:37 AM

## 2021-01-09 ENCOUNTER — Encounter: Payer: Self-pay | Admitting: Orthopedic Surgery

## 2021-01-09 ENCOUNTER — Ambulatory Visit (INDEPENDENT_AMBULATORY_CARE_PROVIDER_SITE_OTHER): Payer: 59 | Admitting: Orthopedic Surgery

## 2021-01-09 ENCOUNTER — Other Ambulatory Visit: Payer: Self-pay

## 2021-01-09 DIAGNOSIS — M7711 Lateral epicondylitis, right elbow: Secondary | ICD-10-CM

## 2021-01-09 NOTE — Progress Notes (Signed)
Orthopaedic Postop Note  Assessment: Marilyn Gomez is a 51 y.o. female s/p right lateral elbow tenotomy for tennis elbow  DOS: 11/26/2020  Plan: Range of motion of the right elbow is improved, but she is just short of full extension.  She should continue to work on range of motion.  I have also provided her with general stretching and strengthening of the right elbow.  The current pain that she is experiencing is to be expected, as she gradually increases her level of activity.  Realistically, it can be 3 months or more before she starts to achieve considerable improvement following surgery.  She has no tenderness to palpation at the site of surgery.  Her incisions are healing well.  Continue with medications as needed.  Icing, and limited strengthening at this time.  She states that [place] of [place] until early [place], and I will see her upon her return.  If she has any issues, encouraged her to contact us through the chart to answer any potential questions.  All questions were answered at this time, she is amenable this plan.   Follow-up: Return in about 2 months (around 03/09/2021). XR at next visit: None  Subjective:  Chief Complaint  Patient presents with   Routine Post Op    RT elbow DOS 11/26/20 Pt states elbow is now hurting and it wasn't before    History of Present Illness: Marilyn Gomez is a 51 y.o. female who presents following the above stated procedure.  Surgery was approximately 6 weeks ago.  At this point, she states that she is having pain at her right elbow.  This is worse than it was before surgery.  She just recently started using her right arm for some activities.  She has no tenderness at the site of surgery.  She does have pain when she reaches for certain things, or starts to grip with her right hand.  She has been icing her elbow, and taking some medications.  She cannot quite achieve full range of motion yet.   Review of Systems: No fevers or  chills No numbness or tingling No Chest Pain No shortness of breath   Objective: There were no vitals taken for this visit.  Physical Exam:  Alert and oriented.  No acute distress.  Right lateral elbow surgical incision is healing well.  No surrounding erythema or drainage.  She has no tenderness palpation at the common extensor origin, or along the incision.  Flexion to 130 degrees.  Approximately 10 degrees short of full extension.  Full active pronation and supination.  She continues to have pain with resisted extension of the long finger and the wrist.  Fingers are warm and well-perfused.  Sensation is intact throughout the right hand.  IMAGING: I personally ordered and reviewed the following images:  No new imaging obtained today.  [name], MD 01/09/2021 11:33 AM

## 2021-01-09 NOTE — Patient Instructions (Signed)
Tennis Elbow Rehab Do exercises exactly as told by your health care provider and adjust them as directed. It is normal to feel mild stretching, pulling, tightness, or discomfort as you do these exercises. Stop right away if you feel sudden pain or your pain gets worse.   Stretching and range-of-motion exercises These exercises warm up your muscles and joints and improve the movement andflexibility of your elbow. Wrist flexion, assisted  Straighten your left / right elbow in front of you with your palm facing down toward the floor. If told by your health care provider, bend your left / right elbow to a 90-degree angle (right angle) at your side instead of holding it straight. With your other hand, gently push over the back of your left / right hand so your fingers point toward the floor (flexion). Stop when you feel a gentle stretch on the back of your forearm. Hold this position for 10 seconds. Repeat 10 times. Complete this exercise 1-2 times a day. Wrist extension, assisted  Straighten your left / right elbow in front of you with your palm facing up toward the ceiling. If told by your health care provider, bend your left / right elbow to a 90-degree angle (right angle) at your side instead of holding it straight. With your other hand, gently pull your left / right hand and fingers toward the floor (extension). Stop when you feel a gentle stretch on the palm side of your forearm. Hold this position for 10 seconds. Repeat 10 times. Complete this exercise 1-2 times a day. Assisted forearm rotation, supination Sit or stand with your elbows at your side. Bend your left / right elbow to a 90-degree angle (right angle). Using your uninjured hand, turn your left / right palm up toward the ceiling (supination) until you feel a gentle stretch along the inside of your forearm. Hold this position for 10 seconds. Repeat 10 times. Complete this exercise 1-2 times a day. Assisted forearm rotation,  pronation Sit or stand with your elbows at your side. Bend your left / right elbow to a 90-degree angle (right angle). Using your uninjured hand, turn your left / right palm down toward the floor (pronation) until you feel a gentle stretch along the outside of your forearm. Hold this position for 10 seconds. Repeat 10 times. Complete this exercise 1-2 times a day. Strengthening exercises These exercises build strength and endurance in your forearm and elbow. Endurance is the ability to use your muscles for a long time, even after theyget tired. Radial deviation  Stand with a 5 lbs weight or a hammer in your left / right hand. Or, sit while holding a rubber exercise band or tubing, with your left / right forearm supported on a table or countertop. Position your forearm so that the thumb is facing the ceiling, as if you are going to clap your hands. This is the neutral position. Raise your hand upward in front of you so your thumb moves toward the ceiling (radial deviation), or pull up on the rubber tubing. Keep your forearm and elbow still while you move your wrist only. Hold this position for 10 seconds. Slowly return to the starting position. Repeat 10 times. Complete this exercise 1-2 times a day. Wrist extension, eccentric Sit with your left / right forearm palm-down and supported on a table or other surface. Let your left / right wrist extend over the edge of the surface. Hold a 5 lbs (can of soup) weight or a piece of exercise   band or tubing in your left / right hand. If using a rubber exercise band or tubing, hold the other end of the tubing with your other hand. Use your uninjured hand to move your left / right hand up toward the ceiling. Take your uninjured hand away and slowly return to the starting position using only your left / right hand. Lowering your arm under tension is called eccentric extension. Repeat 10 times. Complete this exercise 1-2 times a day. Wrist extension  Do  not do this exercise if it causes pain at the outside of your elbow. Only do this exercise once instructed by your health care provider. Sit with your left / right forearm supported on a table or other surface and your palm turned down toward the floor. Let your left / right wrist extend over the edge of the surface. Hold a 5 lbs weight or a piece of rubber exercise band or tubing. If you are using a rubber exercise band or tubing, hold the band or tubing in place with your other hand to provide resistance. Slowly bend your wrist so your hand moves up toward the ceiling (extension). Move only your wrist, keeping your forearm and elbow still. Hold this position for 10 seconds. Slowly return to the starting position. Repeat 10 times. Complete this exercise 1-2 times a day. Forearm rotation, supination To do this exercise, you will need a lightweight hammer or rubber mallet. Sit with your left / right forearm supported on a table or other surface. Bend your elbow to a 90-degree angle (right angle). Position your forearm so that your palm is facing down toward the floor, with your hand resting over the edge of the table. Hold a hammer in your left / right hand. To make this exercise easier, hold the hammer near the head of the hammer. To make this exercise harder, hold the hammer near the end of the handle. Without moving your wrist or elbow, slowly rotate your forearm so your palm faces up toward the ceiling (supination). Hold this position for 10 seconds. Slowly return to the starting position. Repeat 10 times. Complete this exercise 1-2 times a day. Shoulder blade squeeze Sit in a stable chair or stand with good posture. If you are sitting down, do not let your back touch the back of the chair. Your arms should be at your sides with your elbows bent to a 90-degree angle (right angle). Position your forearms so that your thumbs are facing the ceiling (neutral position). Without lifting your  shoulders up, squeeze your shoulder blades tightly together. Hold this position for 10 seconds. Slowly release and return to the starting position. Repeat 10 times. Complete this exercise 1-2 times a day. This information is not intended to replace advice given to you by your health care provider. Make sure you discuss any questions you have with your healthcare provider. Document Revised: 03/16/2019 Document Reviewed: 03/16/2019 Elsevier Patient Education  2022 [employer]  

## 2021-04-24 ENCOUNTER — Ambulatory Visit (INDEPENDENT_AMBULATORY_CARE_PROVIDER_SITE_OTHER): Payer: 59 | Admitting: Orthopedic Surgery

## 2021-04-24 ENCOUNTER — Ambulatory Visit: Payer: 59

## 2021-04-24 ENCOUNTER — Encounter: Payer: Self-pay | Admitting: Orthopedic Surgery

## 2021-04-24 VITALS — BP 123/77 | HR 79 | Ht 72.0 in | Wt 174.0 lb

## 2021-04-24 DIAGNOSIS — M25511 Pain in right shoulder: Secondary | ICD-10-CM

## 2021-04-24 DIAGNOSIS — M7521 Bicipital tendinitis, right shoulder: Secondary | ICD-10-CM

## 2021-04-24 NOTE — Patient Instructions (Signed)
Please call to schedule your appointment with [facility]:   ? ?Central Scheduling ?([phone] ? ?

## 2021-04-25 ENCOUNTER — Encounter: Payer: Self-pay | Admitting: Orthopedic Surgery

## 2021-04-25 NOTE — Progress Notes (Signed)
Orthopaedic Clinic Return ? ?Assessment: ?Marilyn Gomez is a 51 y.o. female with the following: ?Right shoulder pain; pain elicited with testing of long head of the biceps tendon ?Status post right elbow tenotomy for lateral epicondylitis. ? ? ?Plan: ?Mrs. Czerwinski has developed pain in the anterior aspect of the right shoulder.  She has good range of motion.  Her strength is good.  Pain is recreated with testing of the long head of the biceps, with the possibility of a SLAP tear as well.  Low concern for a rotator cuff injury at this time.  She has tried medications, and been doing exercises.  She is not interested in therapy.  I think she would benefit from an image guided glenohumeral joint injection.  She does agree with this plan.  A referral was placed today.  We will see her back pending the efficacy of the injection.  She is not interested in surgery.  However, if her pain persist, we may have to obtain an MRI.  Regarding her right elbow, she notes progressive improvements.  She is a little bit frustrated, but is understanding that the recovery is prolonged for this type of surgery.  She has returned to many of her previous activities, and the pain is slowly resolving. ? ? ? ?Body mass index is 23.6 kg/m?. ? ?Follow-up: ?Return if symptoms worsen or fail to improve. ? ? ?Subjective: ? ?Chief Complaint  ?Patient presents with  ? Shoulder Pain  ?  Rt shoulder pain for 2 months after feeling a pop doing exercises with resistance band.   ? ? ?History of Present Illness: ?Marilyn Gomez is a 51 y.o. female who returns to clinic for evaluation of right shoulder pain.  She previously had a right elbow tenotomy for lateral epicondylitis, 5-6 months ago.  This is progressively improving.  She has returned to many of her activities, although she continues to have some discomfort in the lateral elbow.  A couple months ago, she is doing some exercises using a resistance band, she reached above her head, as well as behind  her back, and noted a popping sensation.  Since then, she has had pain in the anterior aspect of her right shoulder.  Certain activities cause significant pain.  She has continued to try and be active.  No formal physical therapy.  She has tried Tylenol and Aleve.  These have not helped her symptoms.  No previous injections. ? ?Review of Systems: ?No fevers or chills ?No numbness or tingling ?No chest pain ?No shortness of breath ?No bowel or bladder dysfunction ?No GI distress ?No headaches ? ? ?Objective: ?BP 123/77   Pulse 79   Ht 6' (1.829 m)   Wt 174 lb (78.9 kg)   BMI 23.60 kg/m?  ? ?Physical Exam: ? ?Alert and oriented.  No acute distress. ? ?Right shoulder without deformity.  Near full forward flexion, with discomfort in the anterior shoulder.  Internal rotation to the lumbar spine.  External rotation or side to 45 degrees.  Tenderness to palpation over the anterior shoulder.  Positive speeds.  Positive O'Brien's.  Negative Yergason's.  Fingers are warm and well-perfused.  Lateral elbow surgical incisions healing well.  Mild tenderness to palpation over the lateral elbow. ? ? ?IMAGING: ?I personally ordered and reviewed the following images: ? ?X-rays of the right shoulder were obtained in clinic today.  No acute injuries are noted.  Slightly decreased joint space within the glenohumeral joint.  No evidence of proximal humeral migration. ? ?Impression:  Normal right shoulder x-ray ? ?[name], MD ?04/25/2021 ?4:18 PM ? ? ?

## 2021-04-29 ENCOUNTER — Ambulatory Visit (HOSPITAL_COMMUNITY)
Admission: RE | Admit: 2021-04-29 | Discharge: 2021-04-29 | Disposition: A | Discharge status: Home / Self Care | Payer: 59 | Source: Ambulatory Visit | Attending: Orthopedic Surgery | Admitting: Orthopedic Surgery

## 2021-04-29 ENCOUNTER — Encounter (HOSPITAL_COMMUNITY): Payer: Self-pay

## 2021-04-29 DIAGNOSIS — M7521 Bicipital tendinitis, right shoulder: Secondary | ICD-10-CM

## 2021-04-29 MED ORDER — POVIDONE-IODINE 10 % EX SOLN
CUTANEOUS | Status: AC
Start: 2021-04-29 — End: 2021-04-29
  Administered 2021-04-29: 1
  Filled 2021-04-29: qty 14.8

## 2021-04-29 MED ORDER — BUPIVACAINE HCL (PF) 0.5 % IJ SOLN
INTRAMUSCULAR | Status: AC
  Administered 2021-04-29: 5 mL
  Filled 2021-04-29: qty 30

## 2021-04-29 MED ORDER — METHYLPREDNISOLONE ACETATE 40 MG/ML IJ SUSP
INTRAMUSCULAR | Status: AC
  Administered 2021-04-29: 40 mg
  Filled 2021-04-29: qty 1

## 2021-04-29 MED ORDER — LIDOCAINE HCL (PF) 1 % IJ SOLN
INTRAMUSCULAR | Status: AC
Start: 2021-04-29 — End: 2021-04-29
  Administered 2021-04-29: 5 mL
  Filled 2021-04-29: qty 5

## 2021-04-29 MED ORDER — IOHEXOL 180 MG/ML  SOLN
20.0000 mL | Freq: Once | INTRAMUSCULAR | Status: AC | PRN
  Administered 2021-04-29: 1 mL

## 2021-04-29 NOTE — Procedures (Signed)
Preprocedure Dx: RIGHT shoulder pain ?Postprocedure Dx: RIGHT shoulder pain ?Procedure  Fluoroscopically guided RIGHT joint injection therapeutic ?Radiologist:  Thornton Papas ?Anesthesia:  3.5 ml of 1% lidocaine ?Injectate:  40 mg DepoMedrol, 3 ml Sensorcaine 0.5% ?Fluoro time:  0 minutes 24 seconds ?EBL:   None ?Complications: None  ?

## 2021-06-14 ENCOUNTER — Ambulatory Visit: Payer: 59 | Admitting: Orthopedic Surgery

## 2021-09-03 ENCOUNTER — Other Ambulatory Visit (HOSPITAL_COMMUNITY): Payer: Self-pay | Admitting: Neurological Surgery

## 2021-09-03 DIAGNOSIS — M5416 Radiculopathy, lumbar region: Secondary | ICD-10-CM

## 2021-09-07 ENCOUNTER — Ambulatory Visit (HOSPITAL_COMMUNITY)
Admission: RE | Admit: 2021-09-07 | Discharge: 2021-09-07 | Disposition: A | Discharge status: Home / Self Care | Payer: 59 | Source: Ambulatory Visit | Attending: Neurological Surgery | Admitting: Neurological Surgery

## 2021-09-07 DIAGNOSIS — M5416 Radiculopathy, lumbar region: Secondary | ICD-10-CM | POA: Diagnosis not present

## 2021-09-10 ENCOUNTER — Other Ambulatory Visit (HOSPITAL_COMMUNITY): Payer: Self-pay | Admitting: Family Medicine

## 2021-09-10 DIAGNOSIS — Z1231 Encounter for screening mammogram for malignant neoplasm of breast: Secondary | ICD-10-CM

## 2021-10-21 ENCOUNTER — Ambulatory Visit (HOSPITAL_COMMUNITY)
Admission: RE | Admit: 2021-10-21 | Discharge: 2021-10-21 | Disposition: A | Discharge status: Home / Self Care | Payer: 59 | Source: Ambulatory Visit | Attending: Family Medicine | Admitting: Family Medicine

## 2021-10-21 DIAGNOSIS — Z1231 Encounter for screening mammogram for malignant neoplasm of breast: Secondary | ICD-10-CM | POA: Diagnosis present

## 2022-01-08 ENCOUNTER — Emergency Department (HOSPITAL_COMMUNITY)
Admission: EM | Admit: 2022-01-08 | Discharge: 2022-01-09 | Disposition: A | Discharge status: Home / Self Care | Payer: 59 | Source: Home / Self Care | Attending: Emergency Medicine | Admitting: Emergency Medicine

## 2022-01-08 ENCOUNTER — Encounter (HOSPITAL_COMMUNITY): Payer: Self-pay

## 2022-01-08 ENCOUNTER — Emergency Department (HOSPITAL_COMMUNITY): Payer: 59

## 2022-01-08 ENCOUNTER — Emergency Department (HOSPITAL_COMMUNITY)
Admission: EM | Admit: 2022-01-08 | Discharge: 2022-01-08 | Discharge status: Against Medical Advice | Payer: 59 | Attending: Emergency Medicine | Admitting: Emergency Medicine

## 2022-01-08 ENCOUNTER — Other Ambulatory Visit: Payer: Self-pay

## 2022-01-08 DIAGNOSIS — R079 Chest pain, unspecified: Secondary | ICD-10-CM | POA: Diagnosis present

## 2022-01-08 DIAGNOSIS — R071 Chest pain on breathing: Secondary | ICD-10-CM | POA: Diagnosis not present

## 2022-01-08 DIAGNOSIS — Z5329 Procedure and treatment not carried out because of patient's decision for other reasons: Secondary | ICD-10-CM | POA: Insufficient documentation

## 2022-01-08 LAB — BASIC METABOLIC PANEL
Anion gap: 9 (ref 5–15)
BUN: 13 mg/dL (ref 6–20)
CO2: 22 mmol/L (ref 22–32)
Calcium: 8.6 mg/dL — ABNORMAL LOW (ref 8.9–10.3)
Chloride: 106 mmol/L (ref 98–111)
Creatinine, Ser: 0.7 mg/dL (ref 0.44–1.00)
GFR, Estimated: >60 mL/min (ref >60)
Glucose, Bld: 96 mg/dL (ref 70–99)
Potassium: 3.9 mmol/L (ref 3.5–5.1)
Sodium: 137 mmol/L (ref 135–145)

## 2022-01-08 LAB — TROPONIN I (HIGH SENSITIVITY)
Troponin I (High Sensitivity): 2 ng/L (ref <18)
Troponin I (High Sensitivity): 2 ng/L (ref <18)
Troponin I (High Sensitivity): 2 ng/L (ref <18)

## 2022-01-08 LAB — CBC
HCT: 39.9 % (ref 36.0–46.0)
Hemoglobin: 13.1 g/dL (ref 12.0–15.0)
MCH: 30.7 pg (ref 26.0–34.0)
MCHC: 32.8 g/dL (ref 30.0–36.0)
MCV: 93.4 fL (ref 80.0–100.0)
Platelets: 283 10*3/uL (ref 150–400)
RBC: 4.27 MIL/uL (ref 3.87–5.11)
RDW: 13.7 % (ref 11.5–15.5)
WBC: 8.7 10*3/uL (ref 4.0–10.5)
nRBC: 0 % (ref 0.0–0.2)

## 2022-01-08 LAB — D-DIMER, QUANTITATIVE: D-Dimer, Quant: 0.56 ug/mL-FEU — ABNORMAL HIGH (ref 0.00–0.50)

## 2022-01-08 MED ORDER — IBUPROFEN 600 MG PO TABS: 600.0000 mg | ORAL_TABLET | Freq: Four times a day (QID) | ORAL | 0 refills | Status: DC | PRN

## 2022-01-08 MED ORDER — IOHEXOL 350 MG/ML SOLN
75.0000 mL | Freq: Once | INTRAVENOUS | Status: AC | PRN
  Administered 2022-01-08: 75 mL via INTRAVENOUS

## 2022-01-08 MED ORDER — ACETAMINOPHEN 325 MG PO TABS
650.0000 mg | ORAL_TABLET | Freq: Once | ORAL | Status: AC
  Administered 2022-01-08: 650 mg via ORAL
  Filled 2022-01-08: qty 2

## 2022-01-08 MED ORDER — KETOROLAC TROMETHAMINE 60 MG/2ML IM SOLN: 15.0000 mg | Freq: Once | INTRAMUSCULAR | Status: DC

## 2022-01-08 MED ORDER — KETOROLAC TROMETHAMINE 15 MG/ML IJ SOLN
15.0000 mg | Freq: Once | INTRAMUSCULAR | Status: AC
Start: 2022-01-08 — End: 2022-01-08
  Administered 2022-01-08: 15 mg via INTRAVENOUS
  Filled 2022-01-08: qty 1

## 2022-01-08 NOTE — ED Notes (Signed)
Registration notified RN  that pt left the ED from the lobby.

## 2022-01-08 NOTE — ED Triage Notes (Signed)
Pt to ED c/o right sided chest pain that started 1 hour ago when cleaning the house. Worse with deep breaths. Denies n/v.

## 2022-01-08 NOTE — ED Provider Notes (Signed)
[facility] EMERGENCY DEPARTMENT Provider Note   CSN: [id number] Arrival date & time: 01/08/22  1527     History  Chief Complaint  Patient presents with   Chest Pain    Marilyn Gomez is a 52 y.o. female.  She denies any chronic medical conditions.  She presents with a present 2-hour history of right lateral chest pain that is much worse with the standing or breathing, there is no pain on palpation.  No history of VTE, no pain or swelling in her legs.  No shortness of breath.  No nausea or vomiting.  She denies cough or URI symptoms.   Chest Pain Associated symptoms: no palpitations        Home Medications Prior to Admission medications   Medication Sig Start Date End Date Taking? Authorizing Provider  atorvastatin (LIPITOR) 40 MG tablet Take 40 mg by mouth at bedtime.     [provider]  HYDROCORTISONE, TOPICAL, 2 % LOTN Apply 3 mLs topically 3 (three) times daily as needed. 11/26/20   [name], MD  ibuprofen (ADVIL) 600 MG tablet Take 1 tablet (600 mg total) by mouth every 6 (six) hours as needed. 01/08/22   Sherrye Payor A, PA-C  MAGNESIUM PO Take 1 tablet by mouth daily.    [provider]  Rhubarb (ESTROVEN COMPLETE) 4 MG TABS Take by mouth.    [provider]      Allergies    Patient has no known allergies.    Review of Systems   Review of Systems  Cardiovascular:  Positive for chest pain. Negative for palpitations and leg swelling.    Physical Exam Updated Vital Signs BP 112/77   Pulse 86   Temp 98.2 F (36.8 C) (Oral)   Resp 18   Ht 6' (1.829 m)   Wt 79.4 kg   SpO2 95%   BMI 23.73 kg/m  Physical Exam Vitals and nursing note reviewed.  Constitutional:      General: She is not in acute distress.    Appearance: She is well-developed.  HENT:     Head: Normocephalic and atraumatic.  Eyes:     Conjunctiva/sclera: Conjunctivae normal.  Cardiovascular:     Rate and Rhythm: Normal rate and regular rhythm.     Heart  sounds: Normal heart sounds. No murmur heard. Pulmonary:     Effort: Pulmonary effort is normal. No respiratory distress.     Breath sounds: Normal breath sounds.  Chest:     Chest wall: No tenderness or crepitus.     Comments: Pain to right lateral chest wall with rotation of torso Abdominal:     Palpations: Abdomen is soft.     Tenderness: There is no abdominal tenderness.  Musculoskeletal:        General: No swelling. Normal range of motion.     Cervical back: Neck supple.     Right lower leg: No tenderness. No edema.     Left lower leg: No tenderness. No edema.  Skin:    General: Skin is warm and dry.     Capillary Refill: Capillary refill takes less than 2 seconds.  Neurological:     Mental Status: She is alert.  Psychiatric:        Mood and Affect: Mood normal.     ED Results / Procedures / Treatments   Labs (all labs ordered are listed, but only abnormal results are displayed) Labs Reviewed  BASIC METABOLIC PANEL - Abnormal; Notable for the following components:  Result Value   Calcium 8.6 (*)    All other components within normal limits  D-DIMER, QUANTITATIVE - Abnormal; Notable for the following components:   D-Dimer, Quant 0.56 (*)    All other components within normal limits  CBC  TROPONIN I (HIGH SENSITIVITY)    EKG EKG Interpretation  Date/Time:  Wednesday January 08 2022 15:41:49 EST Ventricular Rate:  84 PR Interval:  140 QRS Duration: 80 QT Interval:  352 QTC Calculation: 415 R Axis:   78 Text Interpretation: Normal sinus rhythm with sinus arrhythmia poor data quality limits interpretation otherwise no acute ST/T changes No old tracing to compare Confirmed by Sherwood Gambler [phone]) on 01/08/2022 3:46:20 PM  Radiology CT Angio Chest PE W and/or Wo Contrast  Result Date: 01/08/2022 CLINICAL DATA:  Pulmonary embolism (PE) suspected, low to intermediate prob, positive D-dimer EXAM: CT ANGIOGRAPHY CHEST WITH CONTRAST TECHNIQUE: Multidetector CT  imaging of the chest was performed using the standard protocol during bolus administration of intravenous contrast. Multiplanar CT image reconstructions and MIPs were obtained to evaluate the vascular anatomy. RADIATION DOSE REDUCTION: This exam was performed according to the departmental dose-optimization program which includes automated exposure control, adjustment of the mA and/or kV according to patient size and/or use of iterative reconstruction technique. CONTRAST:  83m OMNIPAQUE IOHEXOL 350 MG/ML SOLN COMPARISON:  Chest x-ray 01/08/2022 FINDINGS: Cardiovascular: Normal heart size. No significant pericardial effusion. The thoracic aorta is normal in caliber. No atherosclerotic plaque of the thoracic aorta. No coronary artery calcifications. Mediastinum/Nodes: Right hilar lymph nodes borderline enlarged up to 1 cm. No enlarged mediastinal, left hilar, or axillary lymph nodes. Thyroid gland, trachea, and esophagus demonstrate no significant findings. Lungs/Pleura: Bibasilar linear atelectasis versus scarring. No focal consolidation. No pulmonary nodule. No pulmonary mass. No pleural effusion. No pneumothorax. Upper Abdomen: No acute abnormality. Musculoskeletal: No chest wall abnormality. No suspicious lytic or blastic osseous lesions. No acute displaced fracture. Review of the MIP images confirms the above findings. IMPRESSION: 1. No pulmonary embolus. 2. No acute intrapulmonary abnormality. 3. Right hilar lymph nodes borderline enlarged up to 1 cm. Recommend attention on follow-up. Electronically Signed   By: MIven FinnM.D.   On: 01/08/2022 21:09   DG Chest 2 View  Result Date: 01/08/2022 CLINICAL DATA:  Chest pain EXAM: CHEST - 2 VIEW COMPARISON:  None Available. FINDINGS: The heart size and mediastinal contours are within normal limits. Both lungs are clear. The visualized skeletal structures are unremarkable. IMPRESSION: No active cardiopulmonary disease. Electronically Signed   By: CFranchot Gallo M.D.   On: 01/08/2022 16:20    Procedures Procedures    Medications Ordered in ED Medications  acetaminophen (TYLENOL) tablet 650 mg (650 mg Oral Given 01/08/22 1710)    ED Course/ Medical Decision Making/ A&P Clinical Course as of 01/08/22 2350  Wed Jan 08, 2022  1656 Near her right sided chest pain.  It is worse with movement and with deep breathing.  She has no chronic medical conditions.  Vitals reassuring.  Volitional ECG and troponins are reassuring.  D-dimer ordered to rule out PE due to her pleuritic pain. [CB]  1857 I discussed with patient that we are going to do testing for PE as she has family history of PE and was having pleuritic pain.  She is agreeable but patient did not want to wait in the ER.  When they came to draw a second troponin and get her for a PE study she had left.  I called her on the  phone to discuss her test results and discussed my concern for a pulmonary embolus which can be dangerous or fatal.  She verbalized understanding and is going to speak with her husband and decide if she going to come back tonight or tomorrow. [CB]    Clinical Course User Index [CB] Gwenevere Abbot, PA-C                           Medical Decision Making Amount and/or Complexity of Data Reviewed Labs: ordered. Radiology: ordered.  Risk OTC drugs.   Patient left prior to PE study being completed.  I called and informed her of results and she plans on returning.  ECG troponin CBC and BMP are all reassuring.        Final Clinical Impression(s) / ED Diagnoses Final diagnoses:  Chest pain on breathing    Rx / DC Orders ED Discharge Orders     None         Darci Current 01/08/22 2350    Sherwood Gambler, MD 01/11/22 2017

## 2022-01-08 NOTE — ED Provider Notes (Signed)
[facility] EMERGENCY DEPARTMENT Provider Note   CSN: [id number] Arrival date & time: 01/08/22  1921     History  Chief Complaint  Patient presents with   Chest Pain    Marilyn Gomez is a 52 y.o. female.  He was seen earlier by myself in the ED for pleuritic right chest pain Please refer to that note for further details but it has been going on for a few hours prior to arrival.  No fevers chills or cough.  Is worse with breathing and.  Is also worse with movement.  Chest Pain Associated symptoms: no palpitations        Home Medications Prior to Admission medications   Medication Sig Start Date End Date Taking? Authorizing Provider  ibuprofen (ADVIL) 600 MG tablet Take 1 tablet (600 mg total) by mouth every 6 (six) hours as needed. 01/08/22  Yes Sidney Silberman A, PA-C  atorvastatin (LIPITOR) 40 MG tablet Take 40 mg by mouth at bedtime.     [provider]  HYDROCORTISONE, TOPICAL, 2 % LOTN Apply 3 mLs topically 3 (three) times daily as needed. 11/26/20   [name], MD  MAGNESIUM PO Take 1 tablet by mouth daily.    [provider]  Rhubarb (ESTROVEN COMPLETE) 4 MG TABS Take by mouth.    [provider]      Allergies    Patient has no known allergies.    Review of Systems   Review of Systems  Cardiovascular:  Positive for chest pain. Negative for palpitations and leg swelling.    Physical Exam Updated Vital Signs BP 104/72   Pulse 71   Temp 98.2 F (36.8 C) (Oral)   Resp 18   Ht 6' (1.829 m)   Wt 79.4 kg   SpO2 98%   BMI 23.73 kg/m  Physical Exam Vitals and nursing note reviewed.  Constitutional:      General: She is not in acute distress.    Appearance: She is well-developed.  HENT:     Head: Normocephalic and atraumatic.  Eyes:     Conjunctiva/sclera: Conjunctivae normal.  Cardiovascular:     Rate and Rhythm: Normal rate and regular rhythm.     Heart sounds: No murmur heard. Pulmonary:     Effort: Pulmonary effort is  normal. No respiratory distress.     Breath sounds: Normal breath sounds.  Chest:     Chest wall: No mass or tenderness.  Abdominal:     Palpations: Abdomen is soft.     Tenderness: There is no abdominal tenderness.  Musculoskeletal:        General: No swelling.     Cervical back: Neck supple.     Comments: Tenderness right lateral chest wall with rotation of the torso  Skin:    General: Skin is warm and dry.     Capillary Refill: Capillary refill takes less than 2 seconds.  Neurological:     Mental Status: She is alert.  Psychiatric:        Mood and Affect: Mood normal.     ED Results / Procedures / Treatments   Labs (all labs ordered are listed, but only abnormal results are displayed) Labs Reviewed  TROPONIN I (HIGH SENSITIVITY)  TROPONIN I (HIGH SENSITIVITY)    EKG None  Radiology CT Angio Chest PE W and/or Wo Contrast  Result Date: 01/08/2022 CLINICAL DATA:  Pulmonary embolism (PE) suspected, low to intermediate prob, positive D-dimer EXAM: CT ANGIOGRAPHY CHEST WITH CONTRAST TECHNIQUE: Multidetector CT  imaging of the chest was performed using the standard protocol during bolus administration of intravenous contrast. Multiplanar CT image reconstructions and MIPs were obtained to evaluate the vascular anatomy. RADIATION DOSE REDUCTION: This exam was performed according to the departmental dose-optimization program which includes automated exposure control, adjustment of the mA and/or kV according to patient size and/or use of iterative reconstruction technique. CONTRAST:  20m OMNIPAQUE IOHEXOL 350 MG/ML SOLN COMPARISON:  Chest x-ray 01/08/2022 FINDINGS: Cardiovascular: Normal heart size. No significant pericardial effusion. The thoracic aorta is normal in caliber. No atherosclerotic plaque of the thoracic aorta. No coronary artery calcifications. Mediastinum/Nodes: Right hilar lymph nodes borderline enlarged up to 1 cm. No enlarged mediastinal, left hilar, or axillary lymph  nodes. Thyroid gland, trachea, and esophagus demonstrate no significant findings. Lungs/Pleura: Bibasilar linear atelectasis versus scarring. No focal consolidation. No pulmonary nodule. No pulmonary mass. No pleural effusion. No pneumothorax. Upper Abdomen: No acute abnormality. Musculoskeletal: No chest wall abnormality. No suspicious lytic or blastic osseous lesions. No acute displaced fracture. Review of the MIP images confirms the above findings. IMPRESSION: 1. No pulmonary embolus. 2. No acute intrapulmonary abnormality. 3. Right hilar lymph nodes borderline enlarged up to 1 cm. Recommend attention on follow-up. Electronically Signed   By: MIven FinnM.D.   On: 01/08/2022 21:09   DG Chest 2 View  Result Date: 01/08/2022 CLINICAL DATA:  Chest pain EXAM: CHEST - 2 VIEW COMPARISON:  None Available. FINDINGS: The heart size and mediastinal contours are within normal limits. Both lungs are clear. The visualized skeletal structures are unremarkable. IMPRESSION: No active cardiopulmonary disease. Electronically Signed   By: CFranchot GalloM.D.   On: 01/08/2022 16:20    Procedures Procedures    Medications Ordered in ED Medications  iohexol (OMNIPAQUE) 350 MG/ML injection 75 mL (75 mLs Intravenous Contrast Given 01/08/22 2059)  ketorolac (TORADOL) 15 MG/ML injection 15 mg (15 mg Intravenous Given 01/08/22 2303)    ED Course/ Medical Decision Making/ A&P                           Medical Decision Making This patient presents to the ED for concern of right lateral chest pain with breathing, this involves an extensive number of treatment options, and is a complaint that carries with it a high risk of complications and morbidity.  The differential diagnosis includes PE, muscle strain, pleurisy, ACS, other      Lab Tests:  I Ordered, and personally interpreted labs.  The pertinent results include:, CBC BMP, all reassuring, troponin is only 2, some labs were from visit earlier today   Imaging  Studies ordered:  I ordered imaging studies including CT a chest I independently visualized and interpreted imaging which showed no pneumonia, no PE I agree with the radiologist interpretation-radiologist also noted some right-sided perihilar lymph nodes that are borderline enlarged   Cardiac Monitoring: / EKG:  ECG shows sinus rhythm, no ST or T wave changes, no signs of ischemia     Problem List / ED Course / Critical interventions / Medication management  Pleuritic chest pain-patient has pleuritic chest pain, no PE in setting of elevated D-dimer from earlier visit today when patient left without finishing her visit.  She came back after a called her to let her know D-dimer was elevated and would need a CT angio.  She got some relief with Tylenol and Toradol which were medications given for pain.  She has no ECG changes concerning for pericarditis  or myocarditis.  She has no positional changes to suggest pericarditis either.  Suspect pain is either pleurisy versus musculoskeletal pain given pain with range of motion.  Patient advised on results, advised on follow-up with PCP for borderline enlarged lymph nodes with PCP, given strict return precautions.  Reevaluation of the patient after these medicines showed that the patient improved I have reviewed the patients home medicines and have made adjustments as needed      Amount and/or Complexity of Data Reviewed Radiology: ordered.  Risk Prescription drug management.           Final Clinical Impression(s) / ED Diagnoses Final diagnoses:  Chest pain on breathing    Rx / DC Orders ED Discharge Orders          Ordered    ibuprofen (ADVIL) 600 MG tablet  Every 6 hours PRN        01/08/22 2313              Gwenevere Abbot, PA-C 01/08/22 2348    Sherwood Gambler, MD 01/11/22 2017

## 2022-01-08 NOTE — ED Triage Notes (Signed)
Pt reports came here earlier and was seen by a PA.  Says she left after being seen by a PA and was asked to come back for a ct scan.

## 2022-01-08 NOTE — Discharge Instructions (Addendum)
Your workup today was reassuring.  Your CT scan did show some borderline enlarged nodes in the right side of your chest.  You need to follow-up close with your primary care doctor for this.  Use the medication to help with pain, follow-up with primary care doctor and come back to the ER if you have any new or worsening symptoms.

## 2022-01-14 ENCOUNTER — Other Ambulatory Visit (HOSPITAL_COMMUNITY): Payer: Self-pay | Admitting: Family Medicine

## 2022-01-14 DIAGNOSIS — R590 Localized enlarged lymph nodes: Secondary | ICD-10-CM

## 2022-01-17 ENCOUNTER — Ambulatory Visit (HOSPITAL_COMMUNITY)
Admission: RE | Admit: 2022-01-17 | Discharge: 2022-01-17 | Disposition: A | Discharge status: Home / Self Care | Payer: 59 | Source: Ambulatory Visit | Attending: Family Medicine | Admitting: Family Medicine

## 2022-01-17 DIAGNOSIS — R590 Localized enlarged lymph nodes: Secondary | ICD-10-CM | POA: Diagnosis not present

## 2022-04-16 ENCOUNTER — Encounter (HOSPITAL_COMMUNITY): Payer: Self-pay | Admitting: Family Medicine

## 2022-04-22 ENCOUNTER — Other Ambulatory Visit (HOSPITAL_COMMUNITY): Payer: Self-pay | Admitting: Family Medicine

## 2022-04-22 DIAGNOSIS — R590 Localized enlarged lymph nodes: Secondary | ICD-10-CM

## 2022-06-26 ENCOUNTER — Other Ambulatory Visit (HOSPITAL_COMMUNITY): Payer: Self-pay | Admitting: Family Medicine

## 2022-06-26 DIAGNOSIS — R590 Localized enlarged lymph nodes: Secondary | ICD-10-CM

## 2022-07-01 ENCOUNTER — Ambulatory Visit (HOSPITAL_COMMUNITY): Payer: 59

## 2022-07-09 ENCOUNTER — Ambulatory Visit (HOSPITAL_COMMUNITY)
Admission: RE | Admit: 2022-07-09 | Discharge: 2022-07-09 | Disposition: A | Discharge status: Home / Self Care | Payer: 59 | Source: Ambulatory Visit | Attending: Family Medicine | Admitting: Family Medicine

## 2022-07-09 DIAGNOSIS — R590 Localized enlarged lymph nodes: Secondary | ICD-10-CM | POA: Diagnosis present

## 2022-08-19 ENCOUNTER — Encounter: Payer: Self-pay | Admitting: *Deleted

## 2022-09-04 ENCOUNTER — Telehealth: Payer: Self-pay | Admitting: *Deleted

## 2022-09-04 NOTE — Telephone Encounter (Signed)
  Procedure: Colonoscopy  Height: 6' Weight: 168lbs        Have you had a colonoscopy before?  10/03/19, Dr. Marletta Lor  Do you have family history of colon cancer?  father  Do you have a family history of polyps?   Previous colonoscopy with polyps removed? yes  Do you have a history colorectal cancer?   no  Are you diabetic?  no  Do you have a prosthetic or mechanical heart valve? no  Do you have a pacemaker/defibrillator?   no  Have you had endocarditis/atrial fibrillation?  no  Do you use supplemental oxygen/CPAP?  no  Have you had joint replacement within the last 12 months?  no  Do you tend to be constipated or have to use laxatives?  no   Do you have history of alcohol use? If yes, how much and how often.  no  Do you have history or are you using drugs? If yes, what do are you  using?  no  Have you ever had a stroke/heart attack?  no  Have you ever had a heart or other vascular stent placed,?no  Do you take weight loss medication? no  female patients,: have you had a hysterectomy? no                              are you post menopausal?  yes                              do you still have your menstrual cycle? no    Date of last menstrual period?   Do you take any blood-thinning medications such as: (Plavix, aspirin, Coumadin, Aggrenox, Brilinta, Xarelto, Eliquis, Pradaxa, Savaysa or Effient)? no  If yes we need the name, milligram, dosage and who is prescribing doctor:              Not taking any medications   No Known Allergies

## 2022-09-12 IMAGING — RF DG FLUORO GUIDE NDL PLC/BX
1 series · 1 of 1 positions shown · non-contrast
Comparison: none

CLINICAL DATA: RIGHT shoulder pain for 3-4 months after exercising,
heard and felt a pop

EXAM:
RIGHT SHOULDER INJECTION UNDER FLUOROSCOPY
TECHNIQUE: Procedure, risks, benefits and alternatives explained to the
patient.

[Series 1: cp_standard · 0.18mm/px · 1 of 1 slices shown]
[im 1/1]
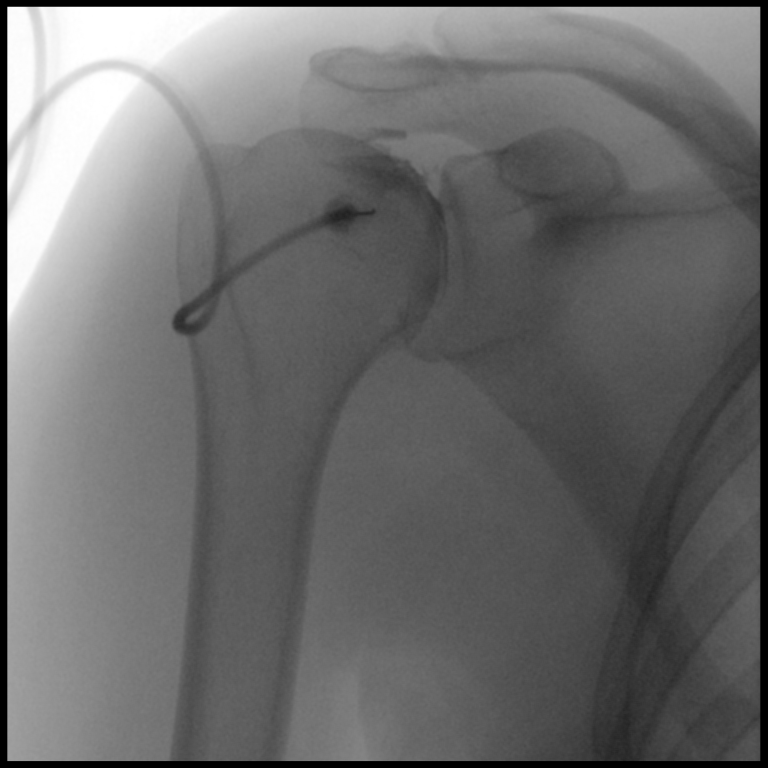

[1 of 1 positions shown; findings below may reference images not displayed]

Patient's questions answered.

Written informed consent obtained.

Timeout protocol followed.

RIGHT shoulder joint localized by fluoroscopy.

Skin prepped and draped in usual sterile fashion.

Skin and soft tissues anesthetized with 3.5 mL of 1% lidocaine.

Under fluoroscopic guidance, 22 gauge spinal needle was advanced
into RIGHT shoulder joint.

Intra-articular position of needle tip confirmed with 1 mL of
Omnipaque 180.

RIGHT shoulder joint was then injected with 3 mL of Sensorcaine 0.5%
and 40 mg of Depo-Medrol.

Procedure tolerated well by patient without immediate complication.

FLUOROSCOPY:
Radiation Exposure Index (as provided by the fluoroscopic device):
1.7 mGy Kerma
FINDINGS: As above
IMPRESSION: Technically successful therapeutic RIGHT shoulder injection.

## 2022-09-16 ENCOUNTER — Other Ambulatory Visit (HOSPITAL_COMMUNITY): Payer: Self-pay | Admitting: Family Medicine

## 2022-09-16 DIAGNOSIS — Z1231 Encounter for screening mammogram for malignant neoplasm of breast: Secondary | ICD-10-CM

## 2022-09-16 NOTE — Telephone Encounter (Signed)
Noted will call once we have future schedule to schedule

## 2022-09-16 NOTE — Telephone Encounter (Signed)
Okay to schedule. ASA 2 

## 2022-09-23 MED ORDER — PEG 3350-KCL-NA BICARB-NACL 420 G PO SOLR: 4000.0000 mL | Freq: Once | ORAL | 0 refills | Status: AC

## 2022-09-23 NOTE — Addendum Note (Signed)
Addended by: Armstead Peaks on: 09/23/2022 12:21 PM   Modules accepted: Orders

## 2022-09-23 NOTE — Telephone Encounter (Signed)
Spoke with pt. Scheduled for 10/1. Aware will send instructions. Rx for prep to pharmacy.  Checked [employer] for PA and received message "Notification/Precertification Requirement This member's plan does not currently require notification or prior-authorization through the [employer] Notification or Prior-Authorization Program. Please contact a Customer Care Professional at [phone] if you believe the information returned to be in error."

## 2022-09-24 NOTE — Telephone Encounter (Signed)
Questionnaire from recall, no referral needed  

## 2022-10-07 ENCOUNTER — Ambulatory Visit (HOSPITAL_COMMUNITY)
Admission: RE | Admit: 2022-10-07 | Discharge: 2022-10-07 | Disposition: A | Discharge status: Home / Self Care | Payer: 59 | Attending: Internal Medicine | Admitting: Internal Medicine

## 2022-10-07 ENCOUNTER — Other Ambulatory Visit: Payer: Self-pay

## 2022-10-07 ENCOUNTER — Ambulatory Visit (HOSPITAL_COMMUNITY): Payer: 59 | Admitting: Anesthesiology

## 2022-10-07 ENCOUNTER — Ambulatory Visit (HOSPITAL_BASED_OUTPATIENT_CLINIC_OR_DEPARTMENT_OTHER): Payer: 59 | Admitting: Anesthesiology

## 2022-10-07 ENCOUNTER — Encounter (HOSPITAL_COMMUNITY): Payer: Self-pay

## 2022-10-07 ENCOUNTER — Encounter (HOSPITAL_COMMUNITY)
Admission: RE | Disposition: A | Discharge status: Home / Self Care | Payer: Self-pay | Source: Home / Self Care | Attending: Internal Medicine

## 2022-10-07 DIAGNOSIS — Z1211 Encounter for screening for malignant neoplasm of colon: Secondary | ICD-10-CM | POA: Insufficient documentation

## 2022-10-07 DIAGNOSIS — D120 Benign neoplasm of cecum: Secondary | ICD-10-CM | POA: Diagnosis not present

## 2022-10-07 DIAGNOSIS — Z860100 Personal history of colon polyps, unspecified: Secondary | ICD-10-CM

## 2022-10-07 DIAGNOSIS — Z87891 Personal history of nicotine dependence: Secondary | ICD-10-CM | POA: Insufficient documentation

## 2022-10-07 DIAGNOSIS — Z860101 Personal history of adenomatous and serrated colon polyps: Secondary | ICD-10-CM

## 2022-10-07 DIAGNOSIS — K648 Other hemorrhoids: Secondary | ICD-10-CM | POA: Insufficient documentation

## 2022-10-07 DIAGNOSIS — Z800 Family history of malignant neoplasm of digestive organs: Secondary | ICD-10-CM | POA: Insufficient documentation

## 2022-10-07 DIAGNOSIS — K635 Polyp of colon: Secondary | ICD-10-CM | POA: Diagnosis not present

## 2022-10-07 DIAGNOSIS — K649 Unspecified hemorrhoids: Secondary | ICD-10-CM

## 2022-10-07 DIAGNOSIS — Z09 Encounter for follow-up examination after completed treatment for conditions other than malignant neoplasm: Secondary | ICD-10-CM | POA: Insufficient documentation

## 2022-10-07 HISTORY — PX: COLONOSCOPY WITH PROPOFOL: SHX5780

## 2022-10-07 HISTORY — PX: POLYPECTOMY: SHX5525

## 2022-10-07 SURGERY — COLONOSCOPY WITH PROPOFOL
Anesthesia: General

## 2022-10-07 MED ORDER — STERILE WATER FOR IRRIGATION IR SOLN
Status: DC | PRN
  Administered 2022-10-07: 60 mL

## 2022-10-07 MED ORDER — LACTATED RINGERS IV SOLN: INTRAVENOUS | Status: DC

## 2022-10-07 MED ORDER — PROPOFOL 10 MG/ML IV BOLUS
INTRAVENOUS | Status: DC | PRN
  Administered 2022-10-07 (×4): 50 mg via INTRAVENOUS
  Administered 2022-10-07: 30 mg via INTRAVENOUS
  Administered 2022-10-07: 120 mg via INTRAVENOUS

## 2022-10-07 MED ORDER — LIDOCAINE HCL (CARDIAC) PF 100 MG/5ML IV SOSY
PREFILLED_SYRINGE | INTRAVENOUS | Status: DC | PRN
  Administered 2022-10-07: 50 mg via INTRAVENOUS

## 2022-10-07 NOTE — Anesthesia Preprocedure Evaluation (Addendum)
Anesthesia Evaluation  Patient identified by MRN, date of birth, ID band Patient awake    Reviewed: Allergy & Precautions, H&P , NPO status , Patient's Chart, lab work & pertinent test results  Airway Mallampati: II  TM Distance: >3 FB Neck ROM: Full    Dental  (+) Caps, Dental Advisory Given,  All front upper are caps:   Pulmonary former smoker   Pulmonary exam normal breath sounds clear to auscultation       Cardiovascular negative cardio ROS Normal cardiovascular exam Rhythm:Regular Rate:Normal     Neuro/Psych negative neurological ROS  negative psych ROS   GI/Hepatic negative GI ROS, Neg liver ROS,,,  Endo/Other  negative endocrine ROS    Renal/GU negative Renal ROS  negative genitourinary   Musculoskeletal negative musculoskeletal ROS (+)    Abdominal   Peds negative pediatric ROS (+)  Hematology negative hematology ROS (+)   Anesthesia Other Findings Back sx  Reproductive/Obstetrics negative OB ROS                             Anesthesia Physical Anesthesia Plan  ASA: 2  Anesthesia Plan: General   Post-op Pain Management: Minimal or no pain anticipated   Induction: Intravenous  PONV Risk Score and Plan: Propofol infusion  Airway Management Planned: LMA, Nasal Cannula and Natural Airway  Additional Equipment:   Intra-op Plan:   Post-operative Plan:   Informed Consent: I have reviewed the patients History and Physical, chart, labs and discussed the procedure including the risks, benefits and alternatives for the proposed anesthesia with the patient or authorized representative who has indicated his/her understanding and acceptance.     Dental advisory given  Plan Discussed with: CRNA  Anesthesia Plan Comments:         Anesthesia Quick Evaluation

## 2022-10-07 NOTE — H&P (Signed)
Primary Care Physician:  Koren Shiver, DO Primary Gastroenterologist:  Dr. Marletta Lor  Pre-Procedure History & Physical: HPI:  Marilyn Gomez is a 52 y.o. female is here for a colonoscopy to be performed for surveillance purposes, personal history of adenomatous colon polyps in 2021  Past Medical History:  Diagnosis Date   Hyperlipemia     Past Surgical History:  Procedure Laterality Date   APPENDECTOMY     BACK SURGERY     COLONOSCOPY WITH PROPOFOL N/A 10/03/2019   Procedure: COLONOSCOPY WITH PROPOFOL;  Surgeon: Lanelle Bal, DO;  Location: [facility];  Service: Endoscopy;  Laterality: N/A;  10:30   POLYPECTOMY  10/03/2019   Procedure: POLYPECTOMY;  Surgeon: Lanelle Bal, DO;  Location: [facility];  Service: Endoscopy;;   TENNIS ELBOW RELEASE/NIRSCHEL PROCEDURE Right 11/26/2020   Procedure: TENNIS ELBOW RELEASE/NIRSCHEL PROCEDURE;  Surgeon: Oliver Barre, MD;  Location: [facility];  Service: Orthopedics;  Laterality: Right;   TUBAL LIGATION      Prior to Admission medications   Not on File    Allergies as of 09/23/2022   (No Known Allergies)    Family History  Problem Relation Age of Onset   Colon cancer Father    Breast cancer Sister    Breast cancer Paternal Aunt    BRCA 1/2 Cousin    Breast cancer Cousin     Social History   Socioeconomic History   Marital status: Married    Spouse name: Not on file   Number of children: Not on file   Years of education: Not on file   Highest education level: Not on file  Occupational History   Not on file  Tobacco Use   Smoking status: Former    Types: E-cigarettes   Smokeless tobacco: Never  Vaping Use   Vaping status: Never Used  Substance and Sexual Activity   Alcohol use: Yes    Comment: rarely   Drug use: Never   Sexual activity: Not on file  Other Topics Concern   Not on file  Social History Narrative   Not on file   Social Determinants of Health   Financial Resource Strain: Not on file   Food Insecurity: Not on file  Transportation Needs: Not on file  Physical Activity: Not on file  Stress: Not on file  Social Connections: Not on file  Intimate Partner Violence: Not on file    Review of Systems: See HPI, otherwise negative ROS  Physical Exam: Vital signs in last 24 hours: Temp:  [97.8 F (36.6 C)] 97.8 F (36.6 C) (10/01 0900) Pulse Rate:  [68] 68 (10/01 0900) Resp:  [18] 18 (10/01 0900) BP: (103)/(67) 103/67 (10/01 0900) SpO2:  [98 %] 98 % (10/01 0900) Weight:  [75.8 kg] 75.8 kg (10/01 0900)   General:   Alert,  Well-developed, well-nourished, pleasant and cooperative in NAD Head:  Normocephalic and atraumatic. Eyes:  Sclera clear, no icterus.   Conjunctiva pink. Ears:  Normal auditory acuity. Nose:  No deformity, discharge,  or lesions. Msk:  Symmetrical without gross deformities. Normal posture. Extremities:  Without clubbing or edema. Neurologic:  Alert and  oriented x4;  grossly normal neurologically. Skin:  Intact without significant lesions or rashes. Psych:  Alert and cooperative. Normal mood and affect.  Impression/Plan: Marilyn Gomez is here for a colonoscopy to be performed for surveillance purposes, personal history of adenomatous colon polyps in 2021  The risks of the procedure including infection, bleed, or perforation as well as  benefits, limitations, alternatives and imponderables have been reviewed with the patient. Questions have been answered. All parties agreeable.

## 2022-10-07 NOTE — Anesthesia Procedure Notes (Signed)
Date/Time: 10/07/2022 10:37 AM  Performed by: Julian Reil, CRNAPre-anesthesia Checklist: Patient identified, Emergency Drugs available, Patient being monitored and Suction available Patient Re-evaluated:Patient Re-evaluated prior to induction Oxygen Delivery Method: Nasal cannula Induction Type: IV induction Placement Confirmation: positive ETCO2

## 2022-10-07 NOTE — Transfer of Care (Signed)
Immediate Anesthesia Transfer of Care Note  Patient: Marilyn Gomez  Procedure(s) Performed: COLONOSCOPY WITH PROPOFOL POLYPECTOMY  Patient Location: Endoscopy Unit  Anesthesia Type:General  Level of Consciousness: awake  Airway & Oxygen Therapy: Patient Spontanous Breathing  Post-op Assessment: Report given to RN and Post -op Vital signs reviewed and stable  Post vital signs: Reviewed and stable  Last Vitals:  Vitals Value Taken Time  BP    Temp    Pulse    Resp    SpO2      Last Pain:  Vitals:   10/07/22 1034  TempSrc:   PainSc: 0-No pain      Patients Stated Pain Goal: 8 (10/07/22 0900)  Complications: No notable events documented.

## 2022-10-07 NOTE — Op Note (Addendum)
[facility] Patient Name: Marilyn Gomez Procedure Date: 10/07/2022 10:31 AM MRN: [id number] Date of Birth: October 01, 1970 Attending MD: Hennie Duos. Maple Mirza, [id number] CSN: [id number] Age: 52 Admit Type: Outpatient Procedure:                Colonoscopy Indications:              Screening in patient at increased risk: Colorectal                            cancer in father before age 77, Surveillance:                            Personal history of colonic polyps (unknown                            histology) on last colonoscopy 3 years ago Providers:                Hennie Duos. Marletta Lor, DO, Crystal Page, Zena Amos Referring MD:              Medicines:                See the Anesthesia note for documentation of the                            administered medications Complications:            No immediate complications. Estimated Blood Loss:     Estimated blood loss was minimal. Procedure:                Pre-Anesthesia Assessment:                           - The anesthesia plan was to use monitored                            anesthesia care (MAC).                           After obtaining informed consent, the colonoscope                            was passed under direct vision. Throughout the                            procedure, the patient's blood pressure, pulse, and                            oxygen saturations were monitored continuously. The                            [id number]-HQ190L ([id number]) was introduced through the                            anus and advanced to the the cecum,  identified by                            appendiceal orifice and ileocecal valve. The                            colonoscopy was performed without difficulty. The                            patient tolerated the procedure well. The quality                            of the bowel preparation was evaluated using the                            BBPS [facility] Bowel Preparation  Scale) with scores                            of: Right Colon = 3, Transverse Colon = 3 and Left                            Colon = 3 (entire mucosa seen well with no residual                            staining, small fragments of stool or opaque                            liquid). The total BBPS score equals 9. Scope In: 10:39:00 AM Scope Out: 10:56:51 AM Scope Withdrawal Time: 0 hours 11 minutes 23 seconds  Total Procedure Duration: 0 hours 17 minutes 51 seconds  Findings:      Hemorrhoids were found on perianal exam.      Non-bleeding internal hemorrhoids were found.      An 8 mm polyp was found in the cecum. The polyp was flat. The polyp was       removed with a cold snare. Resection and retrieval were complete.      The exam was otherwise without abnormality. Impression:               - Hemorrhoids found on perianal exam.                           - Non-bleeding internal hemorrhoids.                           - One 8 mm polyp in the cecum, removed with a cold                            snare. Resected and retrieved.                           - The examination was otherwise normal. Moderate Sedation:      Per Anesthesia Care Recommendation:           - Patient has a contact number available for  emergencies. The signs and symptoms of potential                            delayed complications were discussed with the                            patient. Return to normal activities tomorrow.                            Written discharge instructions were provided to the                            patient.                           - Resume previous diet.                           - Continue present medications.                           - Await pathology results.                           - Repeat colonoscopy in 5 years for surveillance                            and family history of colon cancer in father                           - Return to GI clinic  PRN. Procedure Code(s):        --- Professional ---                           [phone], Colonoscopy, flexible; with removal of                            tumor(s), polyp(s), or other lesion(s) by snare                            technique Diagnosis Code(s):        --- Professional ---                           Z80.0, Family history of malignant neoplasm of                            digestive organs                           Z86.010, Personal history of colonic polyps                           D12.0, Benign neoplasm of cecum                           K64.8, Other hemorrhoids CPT  copyright 2022 American Medical Association. All rights reserved. The codes documented in this report are preliminary and upon coder review may  be revised to meet current compliance requirements. Hennie Duos. Marletta Lor, DO Hennie Duos. Marletta Lor, DO 10/07/2022 10:59:44 AM This report has been signed electronically. Number of Addenda: 0

## 2022-10-07 NOTE — Anesthesia Postprocedure Evaluation (Signed)
Anesthesia Post Note  Patient: Marilyn Gomez  Procedure(s) Performed: COLONOSCOPY WITH PROPOFOL POLYPECTOMY  Patient location during evaluation: PACU Anesthesia Type: General Level of consciousness: awake and alert Pain management: pain level controlled Vital Signs Assessment: post-procedure vital signs reviewed and stable Respiratory status: spontaneous breathing, nonlabored ventilation, respiratory function stable and patient connected to nasal cannula oxygen Cardiovascular status: blood pressure returned to baseline and stable Postop Assessment: no apparent nausea or vomiting Anesthetic complications: no   There were no known notable events for this encounter.   Last Vitals:  Vitals:   10/07/22 0900 10/07/22 1059  BP: 103/67 (!) 92/54  Pulse: 68 81  Resp: 18 18  Temp: 36.6 C 36.7 C  SpO2: 98% 98%    Last Pain:  Vitals:   10/07/22 1059  TempSrc: Oral  PainSc: 0-No pain                 Fedor Kazmierski L Andromeda Poppen

## 2022-10-13 LAB — SURGICAL PATHOLOGY

## 2022-10-16 ENCOUNTER — Encounter (HOSPITAL_COMMUNITY): Payer: Self-pay | Admitting: Internal Medicine

## 2022-10-27 ENCOUNTER — Ambulatory Visit (HOSPITAL_COMMUNITY)
Admission: RE | Admit: 2022-10-27 | Discharge: 2022-10-27 | Disposition: A | Discharge status: Home / Self Care | Payer: 59 | Source: Ambulatory Visit | Attending: Family Medicine | Admitting: Family Medicine

## 2022-10-27 DIAGNOSIS — Z1231 Encounter for screening mammogram for malignant neoplasm of breast: Secondary | ICD-10-CM | POA: Insufficient documentation

## 2023-09-21 ENCOUNTER — Other Ambulatory Visit (HOSPITAL_COMMUNITY): Payer: Self-pay | Admitting: Family Medicine

## 2023-09-21 DIAGNOSIS — Z1231 Encounter for screening mammogram for malignant neoplasm of breast: Secondary | ICD-10-CM

## 2023-10-30 ENCOUNTER — Ambulatory Visit (HOSPITAL_COMMUNITY)
Admission: RE | Admit: 2023-10-30 | Discharge: 2023-10-30 | Disposition: A | Discharge status: Home / Self Care | Source: Ambulatory Visit | Attending: Family Medicine | Admitting: Family Medicine

## 2023-10-30 DIAGNOSIS — Z1231 Encounter for screening mammogram for malignant neoplasm of breast: Secondary | ICD-10-CM | POA: Diagnosis present

## 2024-01-19 ENCOUNTER — Ambulatory Visit: Admitting: Orthopedic Surgery

## 2024-01-19 ENCOUNTER — Other Ambulatory Visit (INDEPENDENT_AMBULATORY_CARE_PROVIDER_SITE_OTHER): Payer: Self-pay

## 2024-01-19 ENCOUNTER — Encounter: Payer: Self-pay | Admitting: Orthopedic Surgery

## 2024-01-19 VITALS — BP 114/73 | HR 75 | Ht 72.0 in | Wt 170.0 lb

## 2024-01-19 DIAGNOSIS — G8929 Other chronic pain: Secondary | ICD-10-CM | POA: Diagnosis not present

## 2024-01-19 DIAGNOSIS — M25561 Pain in right knee: Secondary | ICD-10-CM | POA: Diagnosis not present

## 2024-01-19 NOTE — Patient Instructions (Addendum)
 Knee Exercises  Ask your health care provider which exercises are safe for you. Do exercises exactly as told by your health care provider and adjust them as directed. It is normal to feel mild stretching, pulling, tightness, or discomfort as you do these exercises. Stop right away if you feel sudden pain or your pain gets worse. Do not begin these exercises until told by your health care provider.  Stretching and range-of-motion exercises These exercises warm up your muscles and joints and improve the movement and flexibility of your knee. These exercises also help to relieve pain and swelling.  Knee extension, prone Lie on your abdomen (prone position) on a bed. Place your left / right knee just beyond the edge of the surface so your knee is not on the bed. You can put a towel under your left / right thigh just above your kneecap for comfort. Relax your leg muscles and allow gravity to straighten your knee (extension). You should feel a stretch behind your left / right knee. Hold this position for 10 seconds. Scoot up so your knee is supported between repetitions. Repeat 10 times. Complete this exercise 3-4 times per week.     Knee flexion, active Lie on your back with both legs straight. If this causes back discomfort, bend your left / right knee so your foot is flat on the floor. Slowly slide your left / right heel back toward your buttocks. Stop when you feel a gentle stretch in the front of your knee or thigh (flexion). Hold this position for 10 seconds. Slowly slide your left / right heel back to the starting position. Repeat 10 times. Complete this exercise 3-4 times per week.      Quadriceps stretch, prone Lie on your abdomen on a firm surface, such as a bed or padded floor. Bend your left / right knee and hold your ankle. If you cannot reach your ankle or pant leg, loop a belt around your foot and grab the belt instead. Gently pull your heel toward your buttocks. Your knee should  not slide out to the side. You should feel a stretch in the front of your thigh and knee (quadriceps). Hold this position for 10 seconds. Repeat 10 times. Complete this exercise 3-4 times per week.      Hamstring, supine Lie on your back (supine position). Loop a belt or towel over the ball of your left / right foot. The ball of your foot is on the walking surface, right under your toes. Straighten your left / right knee and slowly pull on the belt to raise your leg until you feel a gentle stretch behind your knee (hamstring). Do not let your knee bend while you do this. Keep your other leg flat on the floor. Hold this position for 10 seconds. Repeat 10 times. Complete this exercise 3-4 times per week.   Strengthening exercises These exercises build strength and endurance in your knee. Endurance is the ability to use your muscles for a long time, even after they get tired.  Quadriceps, isometric This exercise stretches the muscles in front of your thigh (quadriceps) without moving your knee joint (isometric). Lie on your back with your left / right leg extended and your other knee bent. Put a rolled towel or small pillow under your knee if told by your health care provider. Slowly tense the muscles in the front of your left / right thigh. You should see your kneecap slide up toward your hip or see increased dimpling  just above the knee. This motion will push the back of the knee toward the floor. For 10 seconds, hold the muscle as tight as you can without increasing your pain. Relax the muscles slowly and completely. Repeat 10 times. Complete this exercise 3-4 times per week. .     Straight leg raises This exercise stretches the muscles in front of your thigh (quadriceps) and the muscles that move your hips (hip flexors). Lie on your back with your left / right leg extended and your other knee bent. Tense the muscles in the front of your left / right thigh. You should see your kneecap  slide up or see increased dimpling just above the knee. Your thigh may even shake a bit. Keep these muscles tight as you raise your leg 4-6 inches (10-15 cm) off the floor. Do not let your knee bend. Hold this position for 10 seconds. Keep these muscles tense as you lower your leg. Relax your muscles slowly and completely after each repetition. Repeat 10 times. Complete this exercise 3-4 times per week.  Hamstring, isometric Lie on your back on a firm surface. Bend your left / right knee about 30 degrees. Dig your left / right heel into the surface as if you are trying to pull it toward your buttocks. Tighten the muscles in the back of your thighs (hamstring) to dig as hard as you can without increasing any pain. Hold this position for 10 seconds. Release the tension gradually and allow your muscles to relax completely for __________ seconds after each repetition. Repeat 10 times. Complete this exercise 3-4 times per week.  Hamstring curls If told by your health care provider, do this exercise while wearing ankle weights. Begin with 5 lb weights. Then increase the weight by 1 lb (0.5 kg) increments. You can also use an exercise band Lie on your abdomen with your legs straight. Bend your left / right knee as far as you can without feeling pain. Keep your hips flat against the floor. Hold this position for 10 seconds. Slowly lower your leg to the starting position. Repeat 10 times. Complete this exercise 3-4 times per week.      Squats This exercise strengthens the muscles in front of your thigh and knee (quadriceps). Stand in front of a table, with your feet and knees pointing straight ahead. You may rest your hands on the table for balance but not for support. Slowly bend your knees and lower your hips like you are going to sit in a chair. Keep your weight over your heels, not over your toes. Keep your lower legs upright so they are parallel with the table legs. Do not let your hips  go lower than your knees. Do not bend lower than told by your health care provider. If your knee pain increases, do not bend as low. Hold the squat position for 10 seconds. Slowly push with your legs to return to standing. Do not use your hands to pull yourself to standing. Repeat 10 times. Complete this exercise 3-4 times per week .     Wall slides This exercise strengthens the muscles in front of your thigh and knee (quadriceps). Lean your back against a smooth wall or door, and walk your feet out 18-24 inches (46-61 cm) from it. Place your feet hip-width apart. Slowly slide down the wall or door until your knees bend 90 degrees. Keep your knees over your heels, not over your toes. Keep your knees in line with your hips. Hold  this position for 10 seconds. Repeat 10 times. Complete this exercise 3-4 times per week.      Straight leg raises This exercise strengthens the muscles that rotate the leg at the hip and move it away from your body (hip abductors). Lie on your side with your left / right leg in the top position. Lie so your head, shoulder, knee, and hip line up. You may bend your bottom knee to help you keep your balance. Roll your hips slightly forward so your hips are stacked directly over each other and your left / right knee is facing forward. Leading with your heel, lift your top leg 4-6 inches (10-15 cm). You should feel the muscles in your outer hip lifting. Do not let your foot drift forward. Do not let your knee roll toward the ceiling. Hold this position for 10 seconds. Slowly return your leg to the starting position. Let your muscles relax completely after each repetition. Repeat 10 times. Complete this exercise 3-4 times per week.      Straight leg raises This exercise stretches the muscles that move your hips away from the front of the pelvis (hip extensors). Lie on your abdomen on a firm surface. You can put a pillow under your hips if that is more  comfortable. Tense the muscles in your buttocks and lift your left / right leg about 4-6 inches (10-15 cm). Keep your knee straight as you lift your leg. Hold this position for 10 seconds. Slowly lower your leg to the starting position. Let your leg relax completely after each repetition. Repeat 10 times. Complete this exercise 3-4 times per week.   We have discussed taking over the counter medications for your pain.  Below are some common medicines to consider.  Also listed are the recommended doses and how to take these medications as a prescription strength medicine.  If you experience any issues or side effects, please discontinue the medicine and contact the office for some assistance.   Tylenol  or acetaminophen  - can be used to help treat minor pains and fevers or chills.  Common doses include 350 mg, 500 mg and 650 mg.  Following surgery, I recommend taking 1000 mg of this medication three times a day.  Some narcotic pain medications contain acetaminophen , so you cannot take additional acetaminophen . This medicine can be bad for your liver.  DO NOT TAKE MORE THAN 4000 mg per day.  Advil, Motrin or Ibuprofen - anti-inflammatory medicines.  Can be used to treat chronic pain and help with swelling.  Common over the counter dose includes 200 mg.  Bottle states you can take 1-2 pills.  Common prescription doses include 600 mg or 800 mg pills.  This medication can be hard on your stomach and your kidneys.  If you have a history of stomach ulcers or kidney issues, you should discuss this with your PCP.  This medicine can be taken 3 times per day.  You should take this medication with food in your stomach.   Aleve  or Naproxen  - anti inflammatory medicines.  Can be used to treat chronic pain and help with swelling. Common over the counter dose is 220 mg.  The bottle states you can take 1 pill, twice a day.   Typical prescription dose is 500 mg two times a day.  This medication can be hard on your stomach  and your kidneys.  If you have a history of stomach ulcers or kidney issues, you should discuss this with your PCP.  This medicine can be taken 2 times per day.  You should take this medication with food in your stomach.    Acetaminopen and ibuprofen/naproxen  can be taking at the same time, but please do not take ibuprofen and naproxen  at the same time.  These medications work the same way and can cause further injury to your stomach and/or kidneys.    Patients taking a blood thinner are often encouraged not to take NSAIDs or medications like ibuprofen or naproxen  because they increase the possibility of internal bleeding.  If you have any questions, you should contact the doctor who recommended these medicines.

## 2024-01-19 NOTE — Progress Notes (Signed)
 Orthopaedic Clinic Return  Assessment & Plan: Marilyn Gomez is a 54 y.o. female with the following: 1. Chronic pain of right knee  Assessment and Plan Assessment & Plan Chronic right knee pain with mild degenerative changes, likely due to mild osteoarthritis and prior athletic activity. Symptoms have mechanical and inflammatory components. No severe arthritis or acute injury noted. - Ibuprofen  at prescription dose for two weeks; provided dosing instructions. - Use compression bandage or sleeve as needed. - Apply ice and heat as previously helpful. - Simple knee strengthening exercises placed in chart; recommended therapist-guided exercises online. - Discussed physical therapy for strengthening and symptom management. - Corticosteroid injection considered for persistent pain, especially before travel. - MRI considered if symptoms do not improve with current measures. - Encouraged follow-up if symptoms worsen or do not improve, especially before planned trip.       Follow-up: Return if symptoms worsen or fail to improve.   Subjective:  Chief Complaint  Patient presents with   Knee Pain    Right 20mo of pain worse in past month, has been wearing ace bandage helps some     Discussed the use of AI scribe software for clinical note transcription with the patient, who gave verbal consent to proceed.  History of Present Illness Marilyn Gomez is a 54 year old female with prior lumbar spinal fusion who presents for evaluation of right knee pain.  Right knee pain has been intermittent for 6 months with recent worsening. Pain alternates between anterior and posterior knee and can be severe enough to limit driving and stair climbing. Symptoms can fully resolve for up to 2 weeks before recurring.  Pain is aggravated by frequent stair use, prolonged standing, and work activity. She notes popping with turning and occasional swelling that improves with compression. Compression bandages and  braces help most. She uses ice and heat as needed. Topical Voltaren has not helped and she has not tried oral medications.  She denies recent right knee trauma. She had a remote fall onto the right knee in childhood with fluid collection but no issues since. She previously exercised daily but is now limited by right knee pain. She has mild intermittent left knee discomfort but the right knee is the main problem.    Review of Systems: No fevers or chills No numbness or tingling No chest pain No shortness of breath No bowel or bladder dysfunction No GI distress No headaches   Objective: BP 114/73   Pulse 75   Ht 6' (1.829 m)   Wt 170 lb (77.1 kg)   BMI 23.06 kg/m   Physical Exam:  Physical Exam MUSCULOSKELETAL: Right knee with good range of motion, no swelling, no increased laxity with varus or valgus stress. Tenderness in the posterior right knee without evidence of a cyst.    IMAGING: I personally ordered and reviewed the following images:  X-rays of the right knee were obtained in clinic today.  No acute injuries are noted.  Neutral overall alignment.  Mild degenerative changes noted.  There is mild loss of joint space within the medial compartment.  Small osteophytes are noted.  Within the patellofemoral compartment, there is some mild loss of joint space.  No significant osteophytes.  No evidence of a chronic injury.  No bony lesions  Impression: Right knee x-rays with mild degenerative changes  Portions of this note were completed with dictation software and mistakes or typos may exist.   Marilyn DELENA Horde, MD 01/19/2024 9:01 AM
